# Patient Record
Sex: Male | Born: 1960 | Race: White | Hispanic: No | Marital: Married | Smoking: Never smoker
Health system: Southern US, Community
[De-identification: ages and names within clinical notes are randomized; demographics above are authoritative.]

## PROBLEM LIST (undated history)

## (undated) DIAGNOSIS — K219 Gastro-esophageal reflux disease without esophagitis: Secondary | ICD-10-CM

## (undated) DIAGNOSIS — R519 Headache, unspecified: Secondary | ICD-10-CM

## (undated) DIAGNOSIS — Z8489 Family history of other specified conditions: Secondary | ICD-10-CM

## (undated) DIAGNOSIS — Z8719 Personal history of other diseases of the digestive system: Secondary | ICD-10-CM

## (undated) DIAGNOSIS — M1990 Unspecified osteoarthritis, unspecified site: Secondary | ICD-10-CM

## (undated) DIAGNOSIS — R51 Headache: Secondary | ICD-10-CM

## (undated) DIAGNOSIS — J189 Pneumonia, unspecified organism: Secondary | ICD-10-CM

## (undated) HISTORY — PX: KNEE ARTHROSCOPY W/ MENISCAL REPAIR: SHX1877

## (undated) HISTORY — PX: HERNIA REPAIR: SHX51

## (undated) HISTORY — PX: COLONOSCOPY: SHX174

## (undated) HISTORY — PX: ESOPHAGOGASTRODUODENOSCOPY: SHX1529

## (undated) HISTORY — DX: Gastro-esophageal reflux disease without esophagitis: K21.9

## (undated) HISTORY — PX: TONSILLECTOMY AND ADENOIDECTOMY: SHX28

## (undated) HISTORY — PX: TRIGGER FINGER RELEASE: SHX641

---

## 2004-05-28 ENCOUNTER — Ambulatory Visit: Payer: Self-pay | Admitting: Internal Medicine

## 2004-09-20 ENCOUNTER — Ambulatory Visit: Payer: Self-pay | Admitting: Specialist

## 2004-10-21 ENCOUNTER — Ambulatory Visit: Payer: Self-pay | Admitting: Specialist

## 2011-10-08 ENCOUNTER — Emergency Department: Payer: Self-pay | Admitting: Emergency Medicine

## 2011-10-08 ENCOUNTER — Ambulatory Visit: Payer: Self-pay | Admitting: Family Medicine

## 2012-01-16 ENCOUNTER — Ambulatory Visit: Payer: Self-pay | Admitting: Gastroenterology

## 2012-01-16 LAB — HM COLONOSCOPY

## 2013-03-24 HISTORY — PX: COLONOSCOPY: SHX174

## 2013-11-11 ENCOUNTER — Ambulatory Visit: Payer: Self-pay | Admitting: Podiatry

## 2013-11-18 ENCOUNTER — Ambulatory Visit: Payer: 59 | Admitting: Podiatry

## 2013-11-18 ENCOUNTER — Encounter: Payer: Self-pay | Admitting: Podiatry

## 2013-11-18 VITALS — BP 127/83 | HR 72 | Resp 16 | Ht 67.0 in | Wt 215.0 lb

## 2013-11-18 DIAGNOSIS — M79609 Pain in unspecified limb: Secondary | ICD-10-CM

## 2013-11-18 DIAGNOSIS — B351 Tinea unguium: Secondary | ICD-10-CM

## 2013-11-18 NOTE — Progress Notes (Signed)
   Subjective:    Patient ID: Jonathan Mayer, male    DOB: 1960-11-28, 53 y.o.   MRN: [id number]  HPI Comments: Its the big toenail on my rt foot. Its been like this for 2 - 3 years. Its gotten worse. Its sensitive feeling. It snags my socks real bad. i havent done anything for it.      Review of Systems  All other systems reviewed and are negative.      Objective:   Physical Exam        Assessment & Plan:

## 2013-11-18 NOTE — Progress Notes (Signed)
Subjective:     Patient ID: Jonathan Mayer, male   DOB: 07/29/1960, 53 y.o.   MRN: [id number]  HPI patient presents stating I have a crack in my right big toenail for several years and some discoloration that I was concerned about   Review of Systems  All other systems reviewed and are negative.      Objective:   Physical Exam  Nursing note and vitals reviewed. Constitutional: He is oriented to person, place, and time.  Cardiovascular: Intact distal pulses.   Musculoskeletal: Normal range of motion.  Neurological: He is oriented to person, place, and time.  Skin: Skin is warm.   neurovascular status intact with muscle strength adequate and range of motion subtalar midtarsal joint within normal limits. 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 I've recommended topical antifungal formula 3 at this time and it the nailbed gets worse we will need to consider removal in the future

## 2017-03-13 ENCOUNTER — Ambulatory Visit (INDEPENDENT_AMBULATORY_CARE_PROVIDER_SITE_OTHER): Payer: 59 | Admitting: Family Medicine

## 2017-03-13 ENCOUNTER — Encounter: Payer: Self-pay | Admitting: Family Medicine

## 2017-03-13 VITALS — BP 136/81 | HR 67 | Temp 97.8°F | Ht 67.0 in | Wt 215.2 lb

## 2017-03-13 DIAGNOSIS — L723 Sebaceous cyst: Secondary | ICD-10-CM | POA: Diagnosis not present

## 2017-03-13 DIAGNOSIS — Z6833 Body mass index (BMI) 33.0-33.9, adult: Secondary | ICD-10-CM | POA: Diagnosis not present

## 2017-03-13 DIAGNOSIS — Z114 Encounter for screening for human immunodeficiency virus [HIV]: Secondary | ICD-10-CM | POA: Diagnosis not present

## 2017-03-13 DIAGNOSIS — Z13220 Encounter for screening for lipoid disorders: Secondary | ICD-10-CM | POA: Diagnosis not present

## 2017-03-13 DIAGNOSIS — E6609 Other obesity due to excess calories: Secondary | ICD-10-CM | POA: Insufficient documentation

## 2017-03-13 DIAGNOSIS — Z1159 Encounter for screening for other viral diseases: Secondary | ICD-10-CM | POA: Diagnosis not present

## 2017-03-13 DIAGNOSIS — K219 Gastro-esophageal reflux disease without esophagitis: Secondary | ICD-10-CM | POA: Diagnosis not present

## 2017-03-13 DIAGNOSIS — Z125 Encounter for screening for malignant neoplasm of prostate: Secondary | ICD-10-CM

## 2017-03-13 DIAGNOSIS — E669 Obesity, unspecified: Secondary | ICD-10-CM | POA: Insufficient documentation

## 2017-03-13 LAB — UA/M W/RFLX CULTURE, ROUTINE
Bilirubin, UA: NEGATIVE
GLUCOSE, UA: NEGATIVE
Ketones, UA: NEGATIVE
LEUKOCYTES UA: NEGATIVE
Nitrite, UA: NEGATIVE
PH UA: 7 (ref 5.0–7.5)
PROTEIN UA: NEGATIVE
RBC UA: NEGATIVE
SPEC GRAV UA: 1.02 (ref 1.005–1.030)
Urobilinogen, Ur: 0.2 mg/dL (ref 0.2–1.0)

## 2017-03-13 NOTE — Patient Instructions (Addendum)
Epidermal Cyst Removal, Care After Refer to this sheet in the next few weeks. These instructions provide you with information about caring for yourself after your procedure. Your health care provider may also give you more specific instructions. Your treatment has been planned according to current medical practices, but problems sometimes occur. Call your health care provider if you have any problems or questions after your procedure. What can I expect after the procedure? After the procedure, it is common to have:  Soreness in the area where your cyst was removed.  Tightness or itching from your skin sutures.  Follow these instructions at home:  Take medicines only as directed by your health care provider.  If you were prescribed an antibiotic medicine, finish all of it even if you start to feel better.  Use antibiotic ointment as directed by your health care provider. Follow the instructions carefully.  There are many different ways to close and cover an incision, including stitches (sutures), skin glue, and adhesive strips. Follow your health care provider's instructions about: ? Incision care. ? Bandage (dressing) changes and removal. ? Incision closure removal.  Keep the bandage (dressing) dry until your health care provider says that it can be removed. Take sponge baths only. Ask your health care provider when you can start showering or taking a bath.  After your dressing is off, check your incision every day for signs of infection. Watch for: ? Redness, swelling, or pain. ? Fluid, blood, or pus.  You can return to your normal activities. Do not do anything that stretches or puts pressure on your incision.  You can return to your normal diet.  Keep all follow-up visits as directed by your health care provider. This is important. Contact a health care provider if:  You have a fever.  Your incision bleeds.  You have redness, swelling, or pain in the incision area.  You  have fluid, blood, or pus coming from your incision.  Your cyst comes back after surgery. This information is not intended to replace advice given to you by your health care provider. Make sure you discuss any questions you have with your health care provider. Document Released: 03/31/2014 Document Revised: 08/16/2015 Document Reviewed: 11/23/2013 Elsevier Interactive Patient Education  2018 [employer]  

## 2017-03-13 NOTE — Assessment & Plan Note (Signed)
Will work on diet and exercise. Call with any concerns.

## 2017-03-13 NOTE — Progress Notes (Signed)
BP 136/81 (BP Location: Left Arm, Patient Position: Sitting, Cuff Size: Normal)   Pulse 67   Temp 97.8 F (36.6 C)   Ht 5\' 7"  (1.702 m)   Wt 215 lb 3 oz (97.6 kg)   SpO2 94%   BMI 33.70 kg/m    Subjective:    Patient ID: Jonathan Mayer, male    DOB: Aug 13, 1960, 56 y.o.   MRN: [id number]  HPI: Jonathan Mayer is a 56 y.o. male who presents today to reestablish care after not being seen since 2015.   Chief Complaint  Patient presents with  . Cyst   LUMP Duration: 8-10 years Location: back Onset: gradual Painful: no Discomfort: yes- itches Status:  not changing Trauma: no Redness: no Bruising: no Recent infection: no Swollen lymph nodes: no Requesting removal: yes History of cancer: no Family history of cancer: no History of the same: no Associated signs and symptoms: none  GERD- under good control with OTC medication. No other concerns.   Active Ambulatory Problems    Diagnosis Date Noted  . Gastroesophageal reflux disease 03/13/2017  . Class 1 obesity due to excess calories without serious comorbidity with body mass index (BMI) of 33.0 to 33.9 in adult 03/13/2017   Resolved Ambulatory Problems    Diagnosis Date Noted  . No Resolved Ambulatory Problems   Past Medical History:  Diagnosis Date  . GERD (gastroesophageal reflux disease)    Past Surgical History:  Procedure Laterality Date  . HERNIA REPAIR    . KNEE ARTHROSCOPY W/ MENISCAL REPAIR Right   . TONSILLECTOMY AND ADENOIDECTOMY      Outpatient Encounter Medications as of 03/13/2017  Medication Sig  . esomeprazole (NEXIUM) 40 MG capsule Take 40 mg by mouth daily at 12 noon.  . fluticasone (FLONASE) 50 MCG/ACT nasal spray Place into both nostrils daily.   No facility-administered encounter medications on file as of 03/13/2017.    Allergies  Allergen Reactions  . Aspirin Itching and Other (See Comments)    Eyes swell  . Ibuprofen Itching and Other (See Comments)    Eyes swell   Social  History   Socioeconomic History  . Marital status: Married    Spouse name: Not on file  . Number of children: Not on file  . Years of education: Not on file  . Highest education level: Not on file  Social Needs  . Financial resource strain: Not on file  . Food insecurity - worry: Not on file  . Food insecurity - inability: Not on file  . Transportation needs - medical: Not on file  . Transportation needs - non-medical: Not on file  Occupational History  . Not on file  Tobacco Use  . Smoking status: Never Smoker  . Smokeless tobacco: Never Used  Substance and Sexual Activity  . Alcohol use: No  . Drug use: No  . Sexual activity: Yes  Other Topics Concern  . Not on file  Social History Narrative  . Not on file   Family History  Problem Relation Age of Onset  . Osteoporosis Mother   . GER disease Father   . Cancer Paternal Uncle   . Hypertension Maternal Grandfather     Review of Systems  Constitutional: Negative.   Respiratory: Negative.   Cardiovascular: Negative.   Skin: Positive for color change. Negative for pallor, rash and wound.       Cyst on low back  Psychiatric/Behavioral: Negative.     Per HPI unless specifically indicated above  Objective:    BP 136/81 (BP Location: Left Arm, Patient Position: Sitting, Cuff Size: Normal)   Pulse 67   Temp 97.8 F (36.6 C)   Ht 5\' 7"  (1.702 m)   Wt 215 lb 3 oz (97.6 kg)   SpO2 94%   BMI 33.70 kg/m   Wt Readings from Last 3 Encounters:  03/13/17 215 lb 3 oz (97.6 kg)  11/18/13 215 lb (97.5 kg)    Physical Exam  Constitutional: He is oriented to person, place, and time. He appears well-developed and well-nourished. No distress.  HENT:  Head: Normocephalic and atraumatic.  Right Ear: Hearing normal.  Left Ear: Hearing normal.  Nose: Nose normal.  Eyes: Conjunctivae and lids are normal. Right eye exhibits no discharge. Left eye exhibits no discharge. No scleral icterus.  Cardiovascular: Normal rate,  regular rhythm, normal heart sounds and intact distal pulses. Exam reveals no gallop and no friction rub.  No murmur heard. Pulmonary/Chest: Effort normal and breath sounds normal. No respiratory distress. He has no wheezes. He has no rales. He exhibits no tenderness.  Musculoskeletal: Normal range of motion.  Neurological: He is alert and oriented to person, place, and time.  Skin: Skin is warm, dry and intact. No rash noted. He is not diaphoretic. No erythema. No pallor.  3.5 cm sebaceous cyst on R lower back  Psychiatric: He has a normal mood and affect. His speech is normal and behavior is normal. Judgment and thought content normal. Cognition and memory are normal.    Results for orders placed or performed in visit on 03/13/17  [facility] COLONOSCOPY  Result Value Ref Range   [facility] Colonoscopy Patient Reported See Report (in chart), Patient Reported      Assessment & Plan:   Problem List Items Addressed This Visit      Digestive   Gastroesophageal reflux disease    Under good control on OTC nexium. Continue to monitor. Call with any concerns.       Relevant Orders   CBC with Differential/Platelet   Comprehensive metabolic panel   TSH   UA/M w/rflx Culture, Routine     Other   Class 1 obesity due to excess calories without serious comorbidity with body mass index (BMI) of 33.0 to 33.9 in adult    Will work on diet and exercise. Call with any concerns.       Relevant Orders   Comprehensive metabolic panel   TSH   UA/M w/rflx Culture, Routine    Other Visit Diagnoses    Sebaceous cyst    -  Primary   Irriated when he sits on a hard backed chair. Would like it removed. Removed today as below. Return for stitches removal in 7-10 days   Relevant Orders   Comprehensive metabolic panel   UA/M w/rflx Culture, Routine   Screening for cholesterol level       Labs drawn today, await results.    Relevant Orders   Lipid Panel w/o Chol/HDL Ratio   Screening for prostate cancer        Labs drawn today, await results.    Relevant Orders   PSA   Need for hepatitis C screening test       Labs drawn today, await results.    Relevant Orders   Hepatitis C Antibody   Encounter for screening for human immunodeficiency virus (HIV)       Labs drawn today, await results.    Relevant Orders   HIV antibody  Skin Procedure  Procedure: Informed consent given.  Area infiltrated with lidocaine with epinephrine.  Lesioncompletely excised and wound closed and dressed    Diagnosis:   ICD-10-CM   1. Sebaceous cyst L72.3 Comprehensive metabolic panel    UA/M w/rflx Culture, Routine   Irriated when he sits on a hard backed chair. Would like it removed. Removed today as below. Return for stitches removal in 7-10 days  2. Screening for cholesterol level Z13.220 Lipid Panel w/o Chol/HDL Ratio   Labs drawn today, await results.   3. Gastroesophageal reflux disease, esophagitis presence not specified K21.9 CBC with Differential/Platelet    Comprehensive metabolic panel    TSH    UA/M w/rflx Culture, Routine  4. Screening for prostate cancer Z12.5 PSA   Labs drawn today, await results.   5. Class 1 obesity due to excess calories without serious comorbidity with body mass index (BMI) of 33.0 to 33.9 in adult E66.09 Comprehensive metabolic panel   H88.50 TSH    UA/M w/rflx Culture, Routine  6. Need for hepatitis C screening test Z11.59 Hepatitis C Antibody   Labs drawn today, await results.   7. Encounter for screening for human immunodeficiency virus (HIV) Z11.4 HIV antibody   Labs drawn today, await results.     Lesion Location/Size: 3.5cm lesion R lower back Physician: MJ Consent:  Risks, benefits, and alternative treatments discussed and all questions were answered.  Patient elected to proceed and verbal consent obtained.  Description: Area prepped and draped using semi-sterile technique. Area locally anesthetized using 3 cc's of lidocaine 2% with epi.  Using a 15 blade  scalpel, a elliptical incision was made encompassing the lesion. Cyst cavity entered and mild amount of cheesy material expressed.  Cyst wall was removed in tact using mosquito hemostat. Adequate hemostastis achieved using wound closure. Repair done using 6 4.0 vicryl simple interrupted stitches.  Post Procedure Instructions: Wound care instructions discussed and patient was instructed to keep area clean and dry.  Signs and symptoms of infection discussed, patient agrees to contact the office ASAP should they occur.  Dressing change recommended every other day. Suture removal in 7-10 days  Follow up plan: Return 7-10 days suture removal, for 3-6 months Physical.

## 2017-03-13 NOTE — Assessment & Plan Note (Signed)
Under good control on OTC nexium. Continue to monitor. Call with any concerns.

## 2017-03-14 LAB — CBC WITH DIFFERENTIAL/PLATELET
BASOS ABS: 0 10*3/uL (ref 0.0–0.2)
BASOS: 0 %
EOS (ABSOLUTE): 0.1 10*3/uL (ref 0.0–0.4)
Eos: 2 %
Hematocrit: 41.3 % (ref 37.5–51.0)
Hemoglobin: 12.9 g/dL — ABNORMAL LOW (ref 13.0–17.7)
IMMATURE GRANS (ABS): 0 10*3/uL (ref 0.0–0.1)
IMMATURE GRANULOCYTES: 0 %
LYMPHS: 20 %
Lymphocytes Absolute: 1.3 10*3/uL (ref 0.7–3.1)
MCH: 23.8 pg — ABNORMAL LOW (ref 26.6–33.0)
MCHC: 31.2 g/dL — ABNORMAL LOW (ref 31.5–35.7)
MCV: 76 fL — AB (ref 79–97)
Monocytes Absolute: 0.4 10*3/uL (ref 0.1–0.9)
Monocytes: 6 %
Neutrophils Absolute: 4.8 10*3/uL (ref 1.4–7.0)
Neutrophils: 72 %
PLATELETS: 325 10*3/uL (ref 150–379)
RBC: 5.42 x10E6/uL (ref 4.14–5.80)
RDW: 17.4 % — ABNORMAL HIGH (ref 12.3–15.4)
WBC: 6.7 10*3/uL (ref 3.4–10.8)

## 2017-03-14 LAB — COMPREHENSIVE METABOLIC PANEL
ALT: 26 IU/L (ref 0–44)
AST: 27 IU/L (ref 0–40)
Albumin/Globulin Ratio: 1.6 (ref 1.2–2.2)
Albumin: 4.4 g/dL (ref 3.5–5.5)
Alkaline Phosphatase: 100 IU/L (ref 39–117)
BUN/Creatinine Ratio: 15 (ref 9–20)
BUN: 20 mg/dL (ref 6–24)
Bilirubin Total: 0.2 mg/dL (ref 0.0–1.2)
CO2: 23 mmol/L (ref 20–29)
Calcium: 9.3 mg/dL (ref 8.7–10.2)
Chloride: 102 mmol/L (ref 96–106)
Creatinine, Ser: 1.35 mg/dL — ABNORMAL HIGH (ref 0.76–1.27)
GFR calc non Af Amer: 58 mL/min/{1.73_m2} — ABNORMAL LOW (ref >59)
GFR, EST AFRICAN AMERICAN: 67 mL/min/{1.73_m2} (ref >59)
Globulin, Total: 2.7 g/dL (ref 1.5–4.5)
Glucose: 81 mg/dL (ref 65–99)
Potassium: 5 mmol/L (ref 3.5–5.2)
Sodium: 139 mmol/L (ref 134–144)
TOTAL PROTEIN: 7.1 g/dL (ref 6.0–8.5)

## 2017-03-14 LAB — LIPID PANEL W/O CHOL/HDL RATIO
Cholesterol, Total: 150 mg/dL (ref 100–199)
HDL: 49 mg/dL (ref >39)
LDL Calculated: 74 mg/dL (ref 0–99)
Triglycerides: 133 mg/dL (ref 0–149)
VLDL Cholesterol Cal: 27 mg/dL (ref 5–40)

## 2017-03-14 LAB — TSH: TSH: 1.12 u[IU]/mL (ref 0.450–4.500)

## 2017-03-14 LAB — HIV ANTIBODY (ROUTINE TESTING W REFLEX): HIV Screen 4th Generation wRfx: NONREACTIVE

## 2017-03-14 LAB — HEPATITIS C ANTIBODY: Hep C Virus Ab: <0.1 s/co ratio (ref 0.0–0.9)

## 2017-03-14 LAB — PSA: Prostate Specific Ag, Serum: 0.9 ng/mL (ref 0.0–4.0)

## 2017-03-16 ENCOUNTER — Telehealth: Payer: Self-pay | Admitting: Family Medicine

## 2017-03-16 ENCOUNTER — Encounter: Payer: Self-pay | Admitting: Family Medicine

## 2017-03-16 DIAGNOSIS — D649 Anemia, unspecified: Secondary | ICD-10-CM | POA: Insufficient documentation

## 2017-03-16 DIAGNOSIS — R7989 Other specified abnormal findings of blood chemistry: Secondary | ICD-10-CM | POA: Insufficient documentation

## 2017-03-16 NOTE — Telephone Encounter (Signed)
Patient notified

## 2017-03-16 NOTE — Telephone Encounter (Signed)
Please let him know that he's a little anemic and his kidney function is up a bit. Everything else looks great. I'd like him to make sure he's drinking plenty of water and we'll check some more blood to find out why he's anemic when he comes back in for his physical. (If that's not booked- let's get him in early in the new year)

## 2017-03-20 ENCOUNTER — Encounter: Payer: Self-pay | Admitting: Family Medicine

## 2017-03-20 ENCOUNTER — Ambulatory Visit (INDEPENDENT_AMBULATORY_CARE_PROVIDER_SITE_OTHER): Payer: 59 | Admitting: Family Medicine

## 2017-03-20 VITALS — BP 148/91 | HR 72 | Wt 225.0 lb

## 2017-03-20 DIAGNOSIS — Z4802 Encounter for removal of sutures: Secondary | ICD-10-CM

## 2017-03-20 NOTE — Progress Notes (Signed)
Pt seen today for suture removal of 6 simple interrupted sutures from a cyst removal 12/21. Doing very well, no fevers, chills, sweats, redness, thick drainage. Pain has been minimal to none without any pain control.   VSS and WNL Steri strips remain intact, no surrounding redness or drainage at incision site. Healing very well. Steri strips removed, wound cleaned with alcohol and all 6 sutures were removed without complication. Wound was then cleaned with betadine wipe, dermabond and steri strips applied and tegaderm bandage placed.   Pt to come back in 5 days for wound check to make sure area is healing well. Return precautions given for fevers, sweats, redness, increasing pain to area, etc.

## 2017-03-23 ENCOUNTER — Ambulatory Visit (INDEPENDENT_AMBULATORY_CARE_PROVIDER_SITE_OTHER): Payer: 59 | Admitting: Unknown Physician Specialty

## 2017-03-23 ENCOUNTER — Encounter: Payer: Self-pay | Admitting: Unknown Physician Specialty

## 2017-03-23 VITALS — BP 135/84 | HR 70 | Temp 98.6°F | Wt 225.0 lb

## 2017-03-23 DIAGNOSIS — T8131XA Disruption of external operation (surgical) wound, not elsewhere classified, initial encounter: Secondary | ICD-10-CM | POA: Diagnosis not present

## 2017-03-23 NOTE — Patient Instructions (Signed)
OK to clean area with soap and water  Change bandage daily and as needed due to drainage  Place antibiotic ointment and gauze over area

## 2017-03-23 NOTE — Progress Notes (Signed)
   BP 135/84   Pulse 70   Temp 98.6 F (37 C) (Oral)   Wt 225 lb (102.1 kg)   SpO2 98%   BMI 35.24 kg/m    Subjective:    Patient ID: Jonathan Mayer, male    DOB: [birth year], 56 y.o.   MRN: [id number]  HPI: Jonathan Mayer is a 56 y.o. male  Chief Complaint  Patient presents with  . Wound Check    pt had sutures removed Friday, states the wound has been oozing a little over the weekend and would just like it checked out    Pt had a sebaceous cyst removed on back.  Sutures removed 3 days ago.  Area was covered with gauze and op-site.  Noted   Relevant past medical, surgical, family and social history reviewed and updated as indicated. Interim medical history since our last visit reviewed. Allergies and medications reviewed and updated.  Review of Systems  Per HPI unless specifically indicated above     Objective:    BP 135/84   Pulse 70   Temp 98.6 F (37 C) (Oral)   Wt 225 lb (102.1 kg)   SpO2 98%   BMI 35.24 kg/m   Wt Readings from Last 3 Encounters:  03/23/17 225 lb (102.1 kg)  03/20/17 225 lb (102.1 kg)  03/13/17 215 lb 3 oz (97.6 kg)    Physical Exam  Constitutional: He is oriented to person, place, and time. He appears well-developed and well-nourished. No distress.  HENT:  Head: Normocephalic and atraumatic.  Eyes: Conjunctivae and lids are normal. Right eye exhibits no discharge. Left eye exhibits no discharge. No scleral icterus.  Cardiovascular: Normal rate.  Pulmonary/Chest: Effort normal.  Abdominal: Normal appearance. There is no splenomegaly or hepatomegaly.  Musculoskeletal: Normal range of motion.  Neurological: He is alert and oriented to person, place, and time.  Skin: Skin is intact.  Wound on back has completely come apart.  Area cleaned, antibiotic ointment placed over area and covered with gauze  Psychiatric: He has a normal mood and affect. His behavior is normal. Judgment and thought content normal.    Results for orders placed or  performed in visit on 03/20/17  [facility] COLONOSCOPY  Result Value Ref Range   [facility] Colonoscopy See Report (in chart) See Report (in chart), Patient Reported      Assessment & Plan:   Problem List Items Addressed This Visit    None    Visit Diagnoses    Dehiscence of operative wound, initial encounter    -  Primary   Pt ed on care.  place antibiotic ointment over area and keep covered with gauze.  Change bandage as often as needed.  Recheck in 4 days.  Written instr given       Follow up plan: Return for appt 3p Fri.

## 2017-03-25 ENCOUNTER — Ambulatory Visit: Payer: 59 | Admitting: Family Medicine

## 2017-03-27 ENCOUNTER — Encounter: Payer: Self-pay | Admitting: Unknown Physician Specialty

## 2017-03-27 ENCOUNTER — Ambulatory Visit (INDEPENDENT_AMBULATORY_CARE_PROVIDER_SITE_OTHER): Payer: 59 | Admitting: Unknown Physician Specialty

## 2017-03-27 DIAGNOSIS — T8130XA Disruption of wound, unspecified, initial encounter: Secondary | ICD-10-CM | POA: Insufficient documentation

## 2017-03-27 NOTE — Assessment & Plan Note (Addendum)
Wound healing well.  Continue to cover with antibiotic ointment and bandage.  Pt ed on care

## 2017-03-27 NOTE — Progress Notes (Signed)
   BP 126/84   Pulse 74   Temp 99.4 F (37.4 C) (Oral)   Wt 220 lb 12.8 oz (100.2 kg)   SpO2 97%   BMI 34.58 kg/m    Subjective:    Patient ID: Jonathan Mayer, male    DOB: 06-Jan-1961, 57 y.o.   MRN: [id number]  HPI: Jonathan Mayer is a 57 y.o. male  Chief Complaint  Patient presents with  . Wound Check   Dehisced wound Pt here for wound recheck.  Changing dressing daily.  Some drainage on gauze  Relevant past medical, surgical, family and social history reviewed and updated as indicated. Interim medical history since our last visit reviewed. Allergies and medications reviewed and updated.  Review of Systems  Per HPI unless specifically indicated above     Objective:    BP 126/84   Pulse 74   Temp 99.4 F (37.4 C) (Oral)   Wt 220 lb 12.8 oz (100.2 kg)   SpO2 97%   BMI 34.58 kg/m   Wt Readings from Last 3 Encounters:  03/27/17 220 lb 12.8 oz (100.2 kg)  03/23/17 225 lb (102.1 kg)  03/20/17 225 lb (102.1 kg)    Physical Exam  Constitutional: He is oriented to person, place, and time. He appears well-developed and well-nourished. No distress.  HENT:  Head: Normocephalic and atraumatic.  Eyes: Conjunctivae and lids are normal. Right eye exhibits no discharge. Left eye exhibits no discharge. No scleral icterus.  Cardiovascular: Normal rate.  Pulmonary/Chest: Effort normal.  Abdominal: Normal appearance. There is no splenomegaly or hepatomegaly.  Musculoskeletal: Normal range of motion.  Neurological: He is alert and oriented to person, place, and time.  Skin: Skin is intact. No rash noted. No pallor.  Wound on back without erythema.  Healthy tissue  Psychiatric: He has a normal mood and affect. His behavior is normal. Judgment and thought content normal.    Results for orders placed or performed in visit on 03/20/17  [facility] COLONOSCOPY  Result Value Ref Range   [facility] Colonoscopy See Report (in chart) See Report (in chart), Patient Reported      Assessment & Plan:    Problem List Items Addressed This Visit      Unprioritized   Wound dehiscence    Wound healing well.  Continue to cover with antibiotic ointment and bandage.  Pt ed on care          Follow up plan: No Follow-up on file.

## 2018-05-18 ENCOUNTER — Emergency Department
Admission: EM | Admit: 2018-05-18 | Discharge: 2018-05-19 | Disposition: A | Discharge status: Home / Self Care | Payer: 59 | Attending: Emergency Medicine | Admitting: Emergency Medicine

## 2018-05-18 ENCOUNTER — Other Ambulatory Visit: Payer: Self-pay

## 2018-05-18 ENCOUNTER — Encounter: Payer: Self-pay | Admitting: Emergency Medicine

## 2018-05-18 DIAGNOSIS — Z79899 Other long term (current) drug therapy: Secondary | ICD-10-CM | POA: Diagnosis not present

## 2018-05-18 DIAGNOSIS — K649 Unspecified hemorrhoids: Secondary | ICD-10-CM | POA: Insufficient documentation

## 2018-05-18 MED ORDER — HYDROCORTISONE 2.5 % RE CREA: TOPICAL_CREAM | RECTAL | 1 refills | Status: DC

## 2018-05-18 MED ORDER — LIDOCAINE 2 % EX GEL: 1.0000 "application " | Freq: Three times a day (TID) | CUTANEOUS | 1 refills | Status: DC | PRN

## 2018-05-18 NOTE — ED Provider Notes (Signed)
[facility] Emergency Department Provider Note  ____________________________________________  Time seen: Approximately 11:47 PM  I have reviewed the triage vital signs and the nursing notes.   HISTORY  Chief Complaint Hemorrhoids    HPI Jonathan Mayer is a 58 y.o. male who presents the emergency department complaining of bleeding hemorrhoid.  Patient states that he has had issues with hemorrhoids since his 4s.  Patient has only had 1 episode of thrombosed hemorrhoid that was drained 7 years ago.  Patient states that he has had increased hemorrhoid symptoms over the past several days.  Tonight as he was wiping, he notes blood to the toilet paper.  Patient reports that this started to "drip" then passed a "possible clot" that had some more bleeding.  Patient was able to control the bleeding prior to arrival.  He is concerned that he may need a thrombosed hemorrhoid drained for further management.  Patient is using Preparation H.  No other medications for his complaint prior to arrival.  Patient does have some intermittent issues with constipation but states that he is using the bathroom normally at this time.  He denies any significant straining.  He denies any significant blood loss from hemorrhoid.    Past Medical History:  Diagnosis Date  . GERD (gastroesophageal reflux disease)     Patient Active Problem List   Diagnosis Date Noted  . Wound dehiscence 03/27/2017  . Anemia 03/16/2017  . Elevated serum creatinine 03/16/2017  . Gastroesophageal reflux disease 03/13/2017  . Class 1 obesity due to excess calories without serious comorbidity with body mass index (BMI) of 33.0 to 33.9 in adult 03/13/2017    Past Surgical History:  Procedure Laterality Date  . HERNIA REPAIR    . KNEE ARTHROSCOPY W/ MENISCAL REPAIR Right   . TONSILLECTOMY AND ADENOIDECTOMY      Prior to Admission medications   Medication Sig Start Date End Date Taking? Authorizing Provider   esomeprazole (NEXIUM) 40 MG capsule Take 40 mg by mouth daily at 12 noon.    [provider]  fluticasone (FLONASE) 50 MCG/ACT nasal spray Place into both nostrils daily.    [provider]  hydrocortisone (ANUSOL-HC) 2.5 % rectal cream Apply rectally 2 times daily 05/18/18 05/18/19  Cuthriell, Charline Bills, PA-C  Lidocaine 2 % GEL Apply 1 application topically 3 (three) times daily as needed. 05/18/18   Cuthriell, Charline Bills, PA-C    Allergies Aspirin and Ibuprofen  Family History  Problem Relation Age of Onset  . Osteoporosis Mother   . GER disease Father   . Cancer Paternal Uncle   . Hypertension Maternal Grandfather     Social History Social History   Tobacco Use  . Smoking status: Never Smoker  . Smokeless tobacco: Never Used  Substance Use Topics  . Alcohol use: No  . Drug use: No     Review of Systems  Constitutional: No fever/chills Eyes: No visual changes. No discharge ENT: No upper respiratory complaints. Cardiovascular: no chest pain. Respiratory: no cough. No SOB. Gastrointestinal: No abdominal pain.  No nausea, no vomiting.  No diarrhea.  No constipation.  Positive for bleeding hemorrhoid Musculoskeletal: Negative for musculoskeletal pain. Skin: Negative for rash, abrasions, lacerations, ecchymosis. Neurological: Negative for headaches, focal weakness or numbness. 10-point ROS otherwise negative.  ____________________________________________   PHYSICAL EXAM:  VITAL SIGNS: ED Triage Vitals [05/18/18 2129]  Enc Vitals Group     BP (!) 152/86     Pulse Rate 66  Resp 18     Temp 97.9 F (36.6 C)     Temp Source Oral     SpO2 99 %     Weight 220 lb (99.8 kg)     Height 5\' 7"  (1.702 m)     Head Circumference      Peak Flow      Pain Score      Pain Loc      Pain Edu?      Excl. in [place]?      Constitutional: Alert and oriented. Well appearing and in no acute distress. Eyes: Conjunctivae are normal. PERRL. EOMI. Head:  Atraumatic. Neck: No stridor.    Cardiovascular: Normal rate, regular rhythm. Normal S1 and S2.  Good peripheral circulation. Respiratory: Normal respiratory effort without tachypnea or retractions. Lungs CTAB. Good air entry to the bases with no decreased or absent breath sounds. Gastrointestinal: Bowel sounds 4 quadrants. Soft and nontender to palpation. No guarding or rigidity. No palpable masses. No distention. No CVA tenderness.  Visualization of the anus reveals external hemorrhoid.  This is not thrombosed at this time.  Patient does have indication of bleeding from hemorrhoid but no active bleeding.  No surrounding indication of infection. Musculoskeletal: Full range of motion to all extremities. No gross deformities appreciated. Neurologic:  Normal speech and language. No gross focal neurologic deficits are appreciated.  Skin:  Skin is warm, dry and intact. No rash noted. Psychiatric: Mood and affect are normal. Speech and behavior are normal. Patient exhibits appropriate insight and judgement.   ____________________________________________   LABS (all labs ordered are listed, but only abnormal results are displayed)  Labs Reviewed - No data to display ____________________________________________  EKG   ____________________________________________  RADIOLOGY   No results found.  ____________________________________________    PROCEDURES  Procedure(s) performed:    Procedures    Medications - No data to display   ____________________________________________   INITIAL IMPRESSION / ASSESSMENT AND PLAN / ED COURSE  Pertinent labs & imaging results that were available during my care of the patient were reviewed by me and considered in my medical decision making (see chart for details).  Review of the  [place] was performed in accordance of the [place] prior to dispensing any controlled drugs.      Patient's diagnosis is consistent with bleeding hemorrhoid.   Patient presents the emergency department for complaint of bleeding hemorrhoid.  Patient states that he has had issues with hemorrhoids for the past 30 years.  Patient had an exposed external hemorrhoid over the past several days.  Tonight it began to bleed.  Patient was able to control bleeding prior to arrival.  On exam, patient does have an external hemorrhoid but no indication of thrombosed hemorrhoid.  Patient likely had mild thrombosis but with rupture of hemorrhoid on its own this has resolved.  No active bleeding.  At this time, no indication for drainage of the hemorrhoid as it is completely flat.  Patient will be prescribed Anusol and topical lidocaine for symptom relief.  I have advised the patient to follow-up with primary care or general surgery as needed.  Return precautions are discussed with patient..  Patient is given ED precautions to return to the ED for any worsening or new symptoms.     ____________________________________________  FINAL CLINICAL IMPRESSION(S) / ED DIAGNOSES  Final diagnoses:  Bleeding hemorrhoid      NEW MEDICATIONS STARTED DURING THIS VISIT:  ED Discharge Orders         Ordered  hydrocortisone (ANUSOL-HC) 2.5 % rectal cream     05/18/18 2350    Lidocaine 2 % GEL  3 times daily PRN     05/18/18 2350              This chart was dictated using voice recognition software/Dragon. Despite best efforts to proofread, errors can occur which can change the meaning. Any change was purely unintentional.    Darletta Moll, PA-C 05/19/18 0043    Nance Pear, MD 05/19/18 [phone]

## 2018-05-18 NOTE — ED Triage Notes (Addendum)
Patient ambulatory to triage with steady gait, without difficulty or distress noted; pt reports hemorrhoidal pain since Saturday, noted blood from hemorroid when wiping tonight; st hx of same since young age; denies any accomp symptoms

## 2018-05-19 ENCOUNTER — Ambulatory Visit: Payer: Self-pay

## 2018-05-19 ENCOUNTER — Ambulatory Visit (INDEPENDENT_AMBULATORY_CARE_PROVIDER_SITE_OTHER): Payer: 59 | Admitting: General Surgery

## 2018-05-19 ENCOUNTER — Encounter: Payer: Self-pay | Admitting: General Surgery

## 2018-05-19 ENCOUNTER — Other Ambulatory Visit: Payer: Self-pay

## 2018-05-19 VITALS — BP 135/91 | HR 69 | Temp 97.7°F | Ht 67.0 in | Wt 212.8 lb

## 2018-05-19 DIAGNOSIS — K645 Perianal venous thrombosis: Secondary | ICD-10-CM | POA: Diagnosis not present

## 2018-05-19 NOTE — Telephone Encounter (Signed)
Pt. Reports he went to ED for external hemorrhoid bleeding. Stopped bleeding and he was sent home. Was told to follow up with Dr. [name], General surgeon. States he has had more bleeding this morning. Discussed with Christan. Pt. Instructed to call surgeon for an appointment. If they did not receive the ED referral, pt. Is to call us back. Pt. Verbalizes understanding. ED follow appointment made for pt. With Dr. Wynetta Emery.  Answer Assessment - Initial Assessment Questions 1. APPEARANCE of BLOOD: "What color is it?" "Is it passed separately, on the surface of the stool, or mixed in with the stool?"      Bright red 2. AMOUNT: "How much blood was passed?"      Unsure, but bled 20 minutes 3. FREQUENCY: "How many times has blood been passed with the stools?"      Not with stool - bleeding hemorrhoid 4. ONSET: "When was the blood first seen in the stools?" (Days or weeks)      Started Saturday. 5. DIARRHEA: "Is there also some diarrhea?" If so, ask: "How many diarrhea stools were passed in past 24 hours?"      No 6. CONSTIPATION: "Do you have constipation?" If so, "How bad is it?"     Yes 7. RECURRENT SYMPTOMS: "Have you had blood in your stools before?" If so, ask: "When was the last time?" and "What happened that time?"      Has this in the past 8. BLOOD THINNERS: "Do you take any blood thinners?" (e.g., Coumadin/warfarin, Pradaxa/dabigatran, aspirin)     No 9. OTHER SYMPTOMS: "Do you have any other symptoms?"  (e.g., abdominal pain, vomiting, dizziness, fever)     No 10. PREGNANCY: "Is there any chance you are pregnant?" "When was your last menstrual period?"       n/a  Protocols used: RECTAL BLEEDING-A-AH

## 2018-05-19 NOTE — Patient Instructions (Addendum)
You will need to pick up 2 Fleets enema. Please use one the night prior to your surgery and the other the day of your surgery.    You have requested to have Hemorrhoid surgery today.   You will be required to do 2 enemas prior to your surgery. The first will be the night prior and the second will be done the morning of surgery.  Constipation is going to be your biggest obstacle following surgery. Use all stool softeners and laxatives as prescribed after your surgery and be sure to drink 72 ounces of water or more every day. This will help to avoid constipation. If you do all of this and you are still having difficulty, please call our office for further instructions as soon as you begin to have difficulty with bowel movements.  You may want to buy a disposable Sitz bath prior to surgery to aid in pain and cleanliness after surgery. The information on how to do use a disposable sitz bath or using a bath tub are below.   Hemorrhoid Surgery After Care Refer to this sheet in the next few weeks. These instructions provide you with information about caring for yourself after your procedure. Your health care provider may also give you more specific instructions. Your treatment has been planned according to current medical practices, but problems sometimes occur. Call your health care provider if you have any problems or questions after your procedure. What can I expect after the procedure? After the procedure, it is common to have:  Rectal pain.  Pain when you are having a bowel movement.  Slight rectal bleeding.  Follow these instructions at home: Medicines  Take over-the-counter and prescription medicines only as told by your health care provider.  Do not drive or operate heavy machinery while taking prescription pain medicine.  Use a stool softener or a bulk laxative as told by your health care provider. Activity  Rest at home. Return to your normal activities as told by your health care  provider.  Do not lift anything that is heavier than 10 lb (4.5 kg).  Do not sit for long periods of time. Take a walk every day or as told by your health care provider.  Do not strain to have a bowel movement. Do not spend a long time sitting on the toilet. Eating and drinking  Eat foods that contain fiber, such as whole grains, beans, nuts, fruits, and vegetables.  Drink enough fluid to keep your urine clear or pale yellow. General instructions  Sit in a warm bath 2-3 times per day to relieve soreness or itching.  Keep all follow-up visits as told by your health care provider. This is important. Contact a health care provider if:  Your pain medicine is not helping.  You have a fever or chills.  You become constipated.  You have trouble passing urine. Get help right away if:  You have very bad rectal pain.  You have heavy bleeding from your rectum. This information is not intended to replace advice given to you by your health care provider. Make sure you discuss any questions you have with your health care provider. Document Released: 05/31/2003 Document Revised: 08/16/2015 Document Reviewed: 06/05/2014 Elsevier Interactive Patient Education  2018 [place] A disposable sitz bath is a plastic basin that fits over the toilet. A bag is hung above the toilet, and the bag is connected to a tube that opens into the basin. The bag is filled with  warm water that flows into the basin through the tube. A sitz bath can be used to help relieve symptoms, clean, and promote healing in the genital and anal areas, as well as in the lower abdomen and buttocks. What are the risks? Sitz baths are generally very safe. It is possible for the skin between the genitals and the anus (perineum) to become infected, but this is rare. You can avoid this by cleaning your sitz bath supplies thoroughly. How to use a disposable sitz bath 1. Close the clamp on the tube. Make  sure the clamp is closed tightly to prevent leakage. 2. Fill the sitz bath basin and the plastic bag with warm water. The water should be warm enough to be comfortable, but not hot. 3. Raise the toilet seat and place the filled basin on the toilet. Make sure the overflow opening is facing toward the back of the toilet. ? If you prefer, you may place the empty basin on the toilet first, and then use the plastic bag to fill the basin with warm water. 4. Hang the filled plastic bag overhead on a hook or towel rack close to the toilet. The bag should be higher than the toilet so that the water will flow down through the tube. 5. Attach the tube to the opening on the basin. Make sure that the tube is attached to the basin tightly to prevent leakage. 6. Sit on the basin and release the clamp. This will allow warm water to flow into the basin and flush the area around your genitals and anus. 7. Remain sitting on the basin for about 15-20 minutes, or as long as told by your health care provider. 8. Stand up and gently pat your skin dry. If directed, apply clean bandages (dressings) to the affected area as told by your health care provider. 9. Carefully remove the basin from the toilet seat and tip the basin into the toilet to empty any remaining water. Empty any remaining water from the plastic bag into the toilet. Then, flush the toilet. 10. Wash the basin with warm water and soap. Let the basin air dry in the sink. You should also let the plastic bag and the tubing air dry. 11. Store the basin, tubing, and plastic bag in a clean, dry area. 12. Wash your hands with soap and water. If soap and water are not available, use hand sanitizer. Contact a health care provider if:  You have symptoms that get worse instead of better.  You develop new skin irritation, redness, or swelling around your genitals or anus. This information is not intended to replace advice given to you by your health care provider. Make  sure you discuss any questions you have with your health care provider. Document Released: 09/09/2011 Document Revised: 08/16/2015 Document Reviewed: 01/28/2015 Elsevier Interactive Patient Education  2018 [employer].   How to Take a [employer] A sitz bath is a warm water bath that is taken while you are sitting down. The water should only come up to your hips and should cover your buttocks. Your health care provider may recommend a sitz bath to help you:  Clean the lower part of your body, including your genital area.  With itching.  With pain.  With sore muscles or muscles that tighten or spasm.  How to take a sitz bath Take 3-4 sitz baths per day or as told by your health care provider. 1. Partially fill a bathtub with warm water. You will only need  the water to be deep enough to cover your hips and buttocks when you are sitting in it. 2. If your health care provider told you to put medicine in the water, follow the directions exactly. 3. Sit in the water and open the tub drain a little. 4. Turn on the warm water again to keep the tub at the correct level. Keep the water running constantly. 5. Soak in the water for 15-20 minutes or as told by your health care provider. 6. After the sitz bath, pat the affected area dry first. Do not rub it. 7. Be careful when you stand up after the sitz bath because you may feel dizzy.  Contact a health care provider if:  Your symptoms get worse. Do not continue with sitz baths if your symptoms get worse.  You have new symptoms. Do not continue with sitz baths until you talk with your health care provider. This information is not intended to replace advice given to you by your health care provider. Make sure you discuss any questions you have with your health care provider. Document Released: 12/01/2003 Document Revised: 08/08/2015 Document Reviewed: 03/08/2014 Elsevier Interactive Patient Education  2018 [employer].          How to  Take a [employer] A sitz bath is a warm water bath that may be used to care for your rectum, genital area, or the area between your rectum and genitals (perineum). For a sitz bath, the water only comes up to your hips and covers your buttocks. A sitz bath may done at home in a bathtub or with a portable sitz bath that fits over the toilet. Your health care provider may recommend a sitz bath to help:  Relieve pain and discomfort after delivering a baby.  Relieve pain and itching from hemorrhoids or anal fissures.  Relieve pain after certain surgeries.  Relax muscles that are sore or tight. How to take a sitz bath Take 3-4 sitz baths a day, or as many as told by your health care provider. Bathtub sitz bath To take a sitz bath in a bathtub: 1. Partially fill a bathtub with warm water. The water should be deep enough to cover your hips and buttocks when you are sitting in the tub. 2. If your health care provider told you to put medicine in the water, follow his or her instructions. 3. Sit in the water. 4. Open the tub drain a little, and leave it open during your bath. 5. Turn on the warm water again, enough to replace the water that is draining out. Keep the water running throughout your bath. This helps keep the water at the right level and the right temperature. 6. Soak in the water for 15-20 minutes, or as long as told by your health care provider. 7. When you are done, be careful when you stand up. You may feel dizzy. 8. After the sitz bath, pat yourself dry. Do not rub your skin to dry it.  Over-the-toilet sitz bath To take a sitz bath with an over-the-toilet basin: 1. Follow the manufacturer's instructions. 2. Fill the basin with warm water. 3. If your health care provider told you to put medicine in the water, follow his or her instructions. 4. Sit on the seat. Make sure the water covers your buttocks and perineum. 5. Soak in the water for 15-20 minutes, or as long as told by your  health care provider. 6. After the sitz bath, pat yourself dry. Do not rub your  skin to dry it. 7. Clean and dry the basin between uses. 8. Discard the basin if it cracks, or according to the manufacturer's instructions. Contact a health care provider if:  Your symptoms get worse. Do not continue with sitz baths if your symptoms get worse.  You have new symptoms. If this happens, do not continue with sitz baths until you talk with your health care provider. Summary  A sitz bath is a warm water bath in which the water only comes up to your hips and covers your buttocks.  A sitz bath may help relieve itching, relieve pain, and relax muscles that are sore or tight in the lower part of your body, including your genital area.  Take 3-4 sitz baths a day, or as many as told by your health care provider. Soak in the water for 15-20 minutes.  Do not continue with sitz baths if your symptoms get worse. This information is not intended to replace advice given to you by your health care provider. Make sure you discuss any questions you have with your health care provider. Document Released: 12/01/2003 Document Revised: 03/12/2017 Document Reviewed: 03/12/2017 Elsevier Interactive Patient Education  2019 [place] capsules What is this medicine? DOCUSATE (doc CUE sayt) is stool softener. It helps prevent constipation and straining or discomfort associated with hard or dry stools. This medicine may be used for other purposes; ask your health care provider or pharmacist if you have questions. COMMON BRAND NAME(S): Colace, Colace Clear, Correctol, D.O.S., DC, Doc-Q-Lace, DocuLace, Docusoft S, DOK, DOK Extra Strength, Dulcolax, Genasoft, Kao-Tin, Kaopectate Liqui-Gels, Phillips Stool Softener, Stool Softener, Stool Softner DC, Sulfolax, Sur-Q-Lax, Surfak, Uni-Ease What should I tell my health care provider before I take this medicine? They need to know if you have any of these  conditions: -nausea or vomiting -severe constipation -stomach pain -sudden change in bowel habit lasting more than 2 weeks -an unusual or allergic reaction to docusate, other medicines, foods, dyes, or preservatives -pregnant or trying to get pregnant -breast-feeding How should I use this medicine? Take this medicine by mouth with a glass of water. Follow the directions on the label. Take your doses at regular intervals. Do not take your medicine more often than directed. Talk to your pediatrician regarding the use of this medicine in children. While this medicine may be prescribed for children as young as 2 years for selected conditions, precautions do apply. Overdosage: If you think you have taken too much of this medicine contact a poison control center or emergency room at once. NOTE: This medicine is only for you. Do not share this medicine with others. What if I miss a dose? If you miss a dose, take it as soon as you can. If it is almost time for your next dose, take only that dose. Do not take double or extra doses. What may interact with this medicine? -mineral oil This list may not describe all possible interactions. Give your health care provider a list of all the medicines, herbs, non-prescription drugs, or dietary supplements you use. Also tell them if you smoke, drink alcohol, or use illegal drugs. Some items may interact with your medicine. What should I watch for while using this medicine? Do not use for more than one week without advice from your doctor or health care professional. If your constipation returns, check with your doctor or health care professional. Drink plenty of water while taking this medicine. Drinking water helps decrease constipation.  Stop using this medicine and contact your doctor or health care professional if you experience any rectal bleeding or do not have a bowel movement after use. These could be signs of a more serious condition. What side effects  may I notice from receiving this medicine? Side effects that you should report to your doctor or health care professional as soon as possible: -allergic reactions like skin rash, itching or hives, swelling of the face, lips, or tongue Side effects that usually do not require medical attention (report to your doctor or health care professional if they continue or are bothersome): -diarrhea -stomach cramps -throat irritation This list may not describe all possible side effects. Call your doctor for medical advice about side effects. You may report side effects to FDA at 1-800-FDA-1088. Where should I keep my medicine? Keep out of the reach of children. Store at room temperature between 15 and 30 degrees C (59 and 86 degrees F). Throw away any unused medicine after the expiration date. NOTE: This sheet is a summary. It may not cover all possible information. If you have questions about this medicine, talk to your doctor, pharmacist, or health care provider.  2019 Elsevier/Gold Standard (2007-07-01 15:56:49)    Fiber Content in Foods  See the following list for the dietary fiber content of some common foods. High-fiber foods High-fiber foods contain 4 grams or more (4g or more) of fiber per serving. They include:  Artichoke (fresh) - 1 medium has 10.3g of fiber.  Baked beans, plain or vegetarian (canned) -  cup has 5.2g of fiber.  Blackberries or raspberries (fresh) -  cup has 4g of fiber.  Bran cereal -  cup has 8.6g of fiber.  Bulgur (cooked) -  cup has 4g of fiber.  Kidney beans (canned) -  cup has 6.8g of fiber.  Lentils (cooked) -  cup has 7.8g of fiber.  Pear (fresh) - 1 medium has 5.1g of fiber.  Peas (frozen) -  cup has 4.4g of fiber.  Pinto beans (canned) -  cup has 5.5g of fiber.  Pinto beans (dried and cooked) -  cup has 7.7g of fiber.  Potato with skin (baked) - 1 medium has 4.4g of fiber.  Quinoa (cooked) -  cup has 5g of fiber.  Soybeans (canned,  frozen, or fresh) -  cup has 5.1g of fiber. Moderate-fiber foods Moderate-fiber foods contain 1-4 grams (1-4g) of fiber per serving. They include:  Almonds - 1 oz. has 3.5g of fiber.  Apple with skin - 1 medium has 3.3g of fiber.  Applesauce, sweetened -  cup has 1.5g of fiber.  Bagel, plain - one 4-inch (10-cm) bagel has 2g of fiber.  Banana - 1 medium has 3.1g of fiber.  Broccoli (cooked) -  cup has 2.5g of fiber.  Carrots (cooked) -  cup has 2.3g of fiber.  Corn (canned or frozen) -  cup has 2.1g of fiber.  Corn tortilla - one 6-inch (15-cm) tortilla has 1.5g of fiber.  Green beans (canned) -  cup has 2g of fiber.  Instant oatmeal -  cup has about 2g of fiber.  Long-grain [place] rice (cooked) - 1 cup has 3.5g of fiber.  Macaroni, enriched (cooked) - 1 cup has 2.5g of fiber.  Melon - 1 cup has 1.4g of fiber.  Multigrain cereal -  cup has about 2-4g of fiber.  Orange - 1 small has 3.1g of fiber.  Potatoes, mashed -  cup has 1.6g of fiber.  Raisins - 1/4 cup  has 1.6g of fiber.  Squash -  cup has 2.9g of fiber.  Sunflower seeds -  cup has 1.1g of fiber.  Tomato - 1 medium has 1.5g of fiber.  Vegetable or soy patty - 1 has 3.4g of fiber.  Whole-wheat bread - 1 slice has 2g of fiber.  Whole-wheat spaghetti -  cup has 3.2g of fiber. Low-fiber foods Low-fiber foods contain less than 1 gram (less than 1g) of fiber per serving. They include:  Egg - 1 large.  Flour tortilla - one 6-inch (15-cm) tortilla.  Fruit juice -  cup.  Lettuce - 1 cup.  Meat, poultry, or fish - 1 oz.  Milk - 1 cup.  Spinach (raw) - 1 cup.  White bread - 1 slice.  White rice -  cup.  Yogurt -  cup. Actual amounts of fiber in foods may be different depending on processing. Talk with your dietitian about how much fiber you need in your diet. This information is not intended to replace advice given to you by your health care provider. Make sure you discuss any  questions you have with your health care provider. Document Released: 07/27/2006 Document Revised: 08/16/2015 Document Reviewed: 05/03/2015 Elsevier Interactive Patient Education  2019 [employer]      Hemorrhoids Hemorrhoids are swollen veins that may develop:  In the butt (rectum). These are called internal hemorrhoids.  Around the opening of the butt (anus). These are called external hemorrhoids. Hemorrhoids can cause pain, itching, or bleeding. Most of the time, they do not cause serious problems. They usually get better with diet changes, lifestyle changes, and other home treatments. What are the causes? This condition may be caused by:  Having trouble pooping (constipation).  Pushing hard (straining) to poop.  Watery poop (diarrhea).  Pregnancy.  Being very overweight (obese).  Sitting for long periods of time.  Heavy lifting or other activity that causes you to strain.  Anal sex.  Riding a bike for a long period of time. What are the signs or symptoms? Symptoms of this condition include:  Pain.  Itching or soreness in the butt.  Bleeding from the butt.  Leaking poop.  Swelling in the area.  One or more lumps around the opening of your butt. How is this diagnosed? A doctor can often diagnose this condition by looking at the affected area. The doctor may also:  Do an exam that involves feeling the area with a gloved hand (digital rectal exam).  Examine the area inside your butt using a small tube (anoscope).  Order blood tests. This may be done if you have lost a lot of blood.  Have you get a test that involves looking inside the colon using a flexible tube with a camera on the end (sigmoidoscopy or colonoscopy). How is this treated? This condition can usually be treated at home. Your doctor may tell you to change what you eat, make lifestyle changes, or try home treatments. If these do not help, procedures can be done to remove the hemorrhoids or  make them smaller. These may involve:  Placing rubber bands at the base of the hemorrhoids to cut off their blood supply.  Injecting medicine into the hemorrhoids to shrink them.  Shining a type of light energy onto the hemorrhoids to cause them to fall off.  Doing surgery to remove the hemorrhoids or cut off their blood supply. Follow these instructions at home: Eating and drinking   Eat foods that have a lot of fiber in them.  These include whole grains, beans, nuts, fruits, and vegetables.  Ask your doctor about taking products that have added fiber (fibersupplements).  Reduce the amount of fat in your diet. You can do this by: ? Eating low-fat dairy products. ? Eating less red meat. ? Avoiding processed foods.  Drink enough fluid to keep your pee (urine) pale yellow. Managing pain and swelling   Take a warm-water bath (sitz bath) for 20 minutes to ease pain. Do this 3-4 times a day. You may do this in a bathtub or using a portable sitz bath that fits over the toilet.  If told, put ice on the painful area. It may be helpful to use ice between your warm baths. ? Put ice in a plastic bag. ? Place a towel between your skin and the bag. ? Leave the ice on for 20 minutes, 2-3 times a day. General instructions  Take over-the-counter and prescription medicines only as told by your doctor. ? Medicated creams and medicines may be used as told.  Exercise often. Ask your doctor how much and what kind of exercise is best for you.  Go to the bathroom when you have the urge to poop. Do not wait.  Avoid pushing too hard when you poop.  Keep your butt dry and clean. Use wet toilet paper or moist towelettes after pooping.  Do not sit on the toilet for a long time.  Keep all follow-up visits as told by your doctor. This is important. Contact a doctor if you:  Have pain and swelling that do not get better with treatment or medicine.  Have trouble pooping.  Cannot poop.  Have  pain or swelling outside the area of the hemorrhoids. Get help right away if you have:  Bleeding that will not stop. Summary  Hemorrhoids are swollen veins in the butt or around the opening of the butt.  They can cause pain, itching, or bleeding.  Eat foods that have a lot of fiber in them. These include whole grains, beans, nuts, fruits, and vegetables.  Take a warm-water bath (sitz bath) for 20 minutes to ease pain. Do this 3-4 times a day. This information is not intended to replace advice given to you by your health care provider. Make sure you discuss any questions you have with your health care provider. Document Released: 12/18/2007 Document Revised: 07/30/2017 Document Reviewed: 07/30/2017 Elsevier Interactive Patient Education  2019 [employer].

## 2018-05-20 ENCOUNTER — Telehealth: Payer: Self-pay

## 2018-05-20 NOTE — Progress Notes (Signed)
Patient ID: Jonathan Mayer, male   DOB: [birth year], 59 y.o.   MRN: [id number]  Chief Complaint  Patient presents with  . Follow-up    new patient/ hospital follow up Bleeding hemorrhoid    HPI Jonathan Mayer is a 58 y.o. male.   Presents today as follow-up from the emergency department, where he went last night due to hemorrhoidal bleeding.  He states that a number of years ago, he had a thrombosed hemorrhoid that was evacuated by his primary care doctor.  He is also had additional hemorrhoid treatments that sound like banding.  He was in his usual state of health until Saturday, when he became constipated.  He had 2 very hard bowel movements Tuesday and noted that there was blood in the toilet.  He tried conservative measures at home, but was unable to completely stop the bleeding.  He was seen in the emergency department at [facility], but by the time he was seen, the bleeding had stopped.  According to the patient, the provider that saw him reported that the visible hemorrhoid was flat, did not appear thrombosed, and was no longer bleeding.  He has utilized topical Preparation H at home and was also given a prescription for lidocaine gel by the emergency department.  He has noticed additional blood on his toilet paper today as well as worsening perianal pain.  He sought attention in our office today to further evaluate and manage his hemorrhoids.   Past Medical History:  Diagnosis Date  . GERD (gastroesophageal reflux disease)     Past Surgical History:  Procedure Laterality Date  . HERNIA REPAIR    . KNEE ARTHROSCOPY W/ MENISCAL REPAIR Right   . TONSILLECTOMY AND ADENOIDECTOMY      Family History  Problem Relation Age of Onset  . Osteoporosis Mother   . GER disease Father   . Cancer Paternal Uncle   . Hypertension Maternal Grandfather     Social History Social History   Tobacco Use  . Smoking status: Never Smoker  . Smokeless tobacco: Never Used    Substance Use Topics  . Alcohol use: No  . Drug use: No    Allergies  Allergen Reactions  . Aspirin Itching and Other (See Comments)    Eyes swell  . Ibuprofen Itching and Other (See Comments)    Eyes swell    Current Outpatient Medications  Medication Sig Dispense Refill  . esomeprazole (NEXIUM) 40 MG capsule Take 40 mg by mouth daily at 12 noon.    . fluticasone (FLONASE) 50 MCG/ACT nasal spray Place into both nostrils daily.    . hydrocortisone (ANUSOL-HC) 2.5 % rectal cream Apply rectally 2 times daily 30 g 1  . Lidocaine 2 % GEL Apply 1 application topically 3 (three) times daily as needed. 30 g 1   No current facility-administered medications for this visit.     Review of Systems Review of Systems  All other systems reviewed and are negative.   Blood pressure (!) 135/91, pulse 69, temperature 97.7 F (36.5 C), temperature source Temporal, height 5\' 7"  (1.702 m), weight 212 lb 12.8 oz (96.5 kg), SpO2 97 %.  Physical Exam Physical Exam Vitals signs reviewed. Exam conducted with a chaperone present.  Constitutional:      General: He is not in acute distress.    Appearance: Normal appearance.  HENT:     Head: Normocephalic and atraumatic.     Nose: Nose normal.     Mouth/Throat:  Mouth: Mucous membranes are moist.     Pharynx: Oropharynx is clear.  Eyes:     General: No scleral icterus.       Right eye: No discharge.        Left eye: No discharge.     Conjunctiva/sclera: Conjunctivae normal.     Pupils: Pupils are equal, round, and reactive to light.  Neck:     Musculoskeletal: Normal range of motion.  Cardiovascular:     Rate and Rhythm: Normal rate and regular rhythm.     Pulses: Normal pulses.  Pulmonary:     Effort: Pulmonary effort is normal.     Breath sounds: Normal breath sounds.  Abdominal:     General: Bowel sounds are normal.     Palpations: Abdomen is soft.  Genitourinary:    Rectum: External hemorrhoid present.       Comments: The  perineum has blood smeared on the skin.  External exam shows a prolapsed hemorrhoid with a clot extruding from the interior.  No active bleeding. Musculoskeletal: Normal range of motion.        General: No swelling or tenderness.  Lymphadenopathy:     Cervical: No cervical adenopathy.  Skin:    General: Skin is warm and dry.  Neurological:     General: No focal deficit present.     Mental Status: He is alert and oriented to person, place, and time.  Psychiatric:        Mood and Affect: Mood normal.        Behavior: Behavior normal.        Thought Content: Thought content normal.        Judgment: Judgment normal.     Data Reviewed No relevant data  Assessment This is a 58 year old man who has a history of hemorrhoids.  These have been treated variously in the past.  He was seen in the emergency department last night due to bleeding, but by the time he was evaluated, the bleeding had stopped.  Today, there is evidence of thrombosis in a prolapsed external hemorrhoid.  The clot was self evacuating.  Additional gentle pressure completely extruded the clot.  He does have evidence of additional hemorrhoidal tissue in the area.  Plan Today we discussed the various methods of dealing with external hemorrhoids.  He feels like he has failed conservative management with topical agents such as Preparation H and Tucks pads.  He says that normally, he has a fiber rich diet and drinks plenty of water but that he continues to have issues despite this.  I have offered him a hemorrhoidectomy.  We discussed the risks of procedure.  These include but are not limited to bleeding, infection, recurrence, and extreme pain.  He was cautioned that he needed to be extremely proactive and avoiding constipation.  I have recommended that he take an over-the-counter fiber supplement such as Metamucil or Benefiber.  MiraLAX would also be an option.  He does wish to proceed with surgical excision of his hemorrhoids.  We  will schedule this.    Jonathan Mayer 05/20/2018, 11:08 AM

## 2018-05-20 NOTE — Telephone Encounter (Signed)
Patient stated he was having some bleeding from the hemorrhoid. He was instructed to continue the sitz baths and apply cold compress to the area.If bleeding starts and he is able to stop by applying pressure then he is to call office or go to the emergency room.  Hemorrhoidectomy is scheduled for next week.

## 2018-05-20 NOTE — H&P (View-Only) (Signed)
Patient ID: Jonathan Mayer, male   DOB: [birth year], 58 y.o.   MRN: [id number]  Chief Complaint  Patient presents with  . Follow-up    new patient/ hospital follow up Bleeding hemorrhoid    HPI Jonathan Mayer is a 58 y.o. male.   Presents today as follow-up from the emergency department, where he went last night due to hemorrhoidal bleeding.  He states that a number of years ago, he had a thrombosed hemorrhoid that was evacuated by his primary care doctor.  He is also had additional hemorrhoid treatments that sound like banding.  He was in his usual state of health until Saturday, when he became constipated.  He had 2 very hard bowel movements Tuesday and noted that there was blood in the toilet.  He tried conservative measures at home, but was unable to completely stop the bleeding.  He was seen in the emergency department at [facility], but by the time he was seen, the bleeding had stopped.  According to the patient, the provider that saw him reported that the visible hemorrhoid was flat, did not appear thrombosed, and was no longer bleeding.  He has utilized topical Preparation H at home and was also given a prescription for lidocaine gel by the emergency department.  He has noticed additional blood on his toilet paper today as well as worsening perianal pain.  He sought attention in our office today to further evaluate and manage his hemorrhoids.   Past Medical History:  Diagnosis Date  . GERD (gastroesophageal reflux disease)     Past Surgical History:  Procedure Laterality Date  . HERNIA REPAIR    . KNEE ARTHROSCOPY W/ MENISCAL REPAIR Right   . TONSILLECTOMY AND ADENOIDECTOMY      Family History  Problem Relation Age of Onset  . Osteoporosis Mother   . GER disease Father   . Cancer Paternal Uncle   . Hypertension Maternal Grandfather     Social History Social History   Tobacco Use  . Smoking status: Never Smoker  . Smokeless tobacco: Never Used    Substance Use Topics  . Alcohol use: No  . Drug use: No    Allergies  Allergen Reactions  . Aspirin Itching and Other (See Comments)    Eyes swell  . Ibuprofen Itching and Other (See Comments)    Eyes swell    Current Outpatient Medications  Medication Sig Dispense Refill  . esomeprazole (NEXIUM) 40 MG capsule Take 40 mg by mouth daily at 12 noon.    . fluticasone (FLONASE) 50 MCG/ACT nasal spray Place into both nostrils daily.    . hydrocortisone (ANUSOL-HC) 2.5 % rectal cream Apply rectally 2 times daily 30 g 1  . Lidocaine 2 % GEL Apply 1 application topically 3 (three) times daily as needed. 30 g 1   No current facility-administered medications for this visit.     Review of Systems Review of Systems  All other systems reviewed and are negative.   Blood pressure (!) 135/91, pulse 69, temperature 97.7 F (36.5 C), temperature source Temporal, height 5\' 7"  (1.702 m), weight 212 lb 12.8 oz (96.5 kg), SpO2 97 %.  Physical Exam Physical Exam Vitals signs reviewed. Exam conducted with a chaperone present.  Constitutional:      General: He is not in acute distress.    Appearance: Normal appearance.  HENT:     Head: Normocephalic and atraumatic.     Nose: Nose normal.     Mouth/Throat:  Mouth: Mucous membranes are moist.     Pharynx: Oropharynx is clear.  Eyes:     General: No scleral icterus.       Right eye: No discharge.        Left eye: No discharge.     Conjunctiva/sclera: Conjunctivae normal.     Pupils: Pupils are equal, round, and reactive to light.  Neck:     Musculoskeletal: Normal range of motion.  Cardiovascular:     Rate and Rhythm: Normal rate and regular rhythm.     Pulses: Normal pulses.  Pulmonary:     Effort: Pulmonary effort is normal.     Breath sounds: Normal breath sounds.  Abdominal:     General: Bowel sounds are normal.     Palpations: Abdomen is soft.  Genitourinary:    Rectum: External hemorrhoid present.       Comments: The  perineum has blood smeared on the skin.  External exam shows a prolapsed hemorrhoid with a clot extruding from the interior.  No active bleeding. Musculoskeletal: Normal range of motion.        General: No swelling or tenderness.  Lymphadenopathy:     Cervical: No cervical adenopathy.  Skin:    General: Skin is warm and dry.  Neurological:     General: No focal deficit present.     Mental Status: He is alert and oriented to person, place, and time.  Psychiatric:        Mood and Affect: Mood normal.        Behavior: Behavior normal.        Thought Content: Thought content normal.        Judgment: Judgment normal.     Data Reviewed No relevant data  Assessment This is a 58 year old man who has a history of hemorrhoids.  These have been treated variously in the past.  He was seen in the emergency department last night due to bleeding, but by the time he was evaluated, the bleeding had stopped.  Today, there is evidence of thrombosis in a prolapsed external hemorrhoid.  The clot was self evacuating.  Additional gentle pressure completely extruded the clot.  He does have evidence of additional hemorrhoidal tissue in the area.  Plan Today we discussed the various methods of dealing with external hemorrhoids.  He feels like he has failed conservative management with topical agents such as Preparation H and Tucks pads.  He says that normally, he has a fiber rich diet and drinks plenty of water but that he continues to have issues despite this.  I have offered him a hemorrhoidectomy.  We discussed the risks of procedure.  These include but are not limited to bleeding, infection, recurrence, and extreme pain.  He was cautioned that he needed to be extremely proactive and avoiding constipation.  I have recommended that he take an over-the-counter fiber supplement such as Metamucil or Benefiber.  MiraLAX would also be an option.  He does wish to proceed with surgical excision of his hemorrhoids.  We  will schedule this.    Jonathan Mayer 05/20/2018, 11:08 AM

## 2018-05-21 ENCOUNTER — Other Ambulatory Visit: Payer: Self-pay

## 2018-05-21 ENCOUNTER — Encounter
Admission: RE | Admit: 2018-05-21 | Discharge: 2018-05-21 | Disposition: A | Discharge status: Home / Self Care | Payer: 59 | Source: Ambulatory Visit | Attending: General Surgery | Admitting: General Surgery

## 2018-05-21 HISTORY — DX: Family history of other specified conditions: Z84.89

## 2018-05-21 HISTORY — DX: Pneumonia, unspecified organism: J18.9

## 2018-05-21 HISTORY — DX: Unspecified osteoarthritis, unspecified site: M19.90

## 2018-05-21 HISTORY — DX: Headache: R51

## 2018-05-21 HISTORY — DX: Headache, unspecified: R51.9

## 2018-05-21 NOTE — Patient Instructions (Signed)
Your procedure is scheduled on: 05-25-18 Report to Same Day Surgery 2nd floor medical mall [facility] Entrance-take elevator on left to 2nd floor.  Check in with surgery information desk.) To find out your arrival time please call [phone] between 1PM - 3PM on 05-24-18  Remember: Instructions that are not followed completely may result in serious medical risk, up to and including death, or upon the discretion of your surgeon and anesthesiologist your surgery may need to be rescheduled.    _x___ 1. Do not eat food after midnight the night before your procedure. You may drink clear liquids up to 2 hours before you are scheduled to arrive at the hospital for your procedure.  Do not drink clear liquids within 2 hours of your scheduled arrival to the hospital.  Clear liquids include  --Water or Apple juice without pulp  --Clear carbohydrate beverage such as ClearFast or Gatorade  --Black Coffee or Clear Tea (No milk, no creamers, do not add anything to the coffee or Tea   ____Ensure clear carbohydrate drink on the way to the hospital for bariatric patients  ____Ensure clear carbohydrate drink 3 hours before surgery for Dr Dwyane Luo patients if physician instructed.   No gum chewing or hard candies.     __x__ 2. No Alcohol for 24 hours before or after surgery.   __x__3. No Smoking or e-cigarettes for 24 prior to surgery.  Do not use any chewable tobacco products for at least 6 hour prior to surgery   ____  4. Bring all medications with you on the day of surgery if instructed.    __x__ 5. Notify your doctor if there is any change in your medical condition     (cold, fever, infections).    x___6. On the morning of surgery brush your teeth with toothpaste and water.  You may rinse your mouth with mouth wash if you wish.  Do not swallow any toothpaste or mouthwash.   Do not wear jewelry, make-up, hairpins, clips or nail polish.  Do not wear lotions, powders, or perfumes. You may wear  deodorant.  Do not shave 48 hours prior to surgery. Men may shave face and neck.  Do not bring valuables to the hospital.    [facility] is not responsible for any belongings or valuables.               Contacts, dentures or bridgework may not be worn into surgery.  Leave your suitcase in the car. After surgery it may be brought to your room.  For patients admitted to the hospital, discharge time is determined by your treatment team.  _  Patients discharged the day of surgery will not be allowed to drive home.  You will need someone to drive you home and stay with you the night of your procedure.    Please read over the following fact sheets that you were given:   [facility] Preparing for Surgery and or MRSA Information   _x___ TAKE THE FOLLOWING MEDICATION THE MORNING OF SURGERY WITH A SMALL SIP OF WATER. These include:  1. [place]  2. MAGNESIUM OXIDE  3.  4.  5.  6.  _X___Fleets enema as directed-FLEET ENEMA TO BE DONE THE NIGHT BEFORE SURGERY AND THE OTHER I HOUR PRIOR TO ARRIVAL TIME THE MORNING OF SURGERY  ____ Use CHG Soap or sage wipes as directed on instruction sheet   ____ Use inhalers on the day of surgery and bring to hospital day of surgery  ____  Stop Metformin and Janumet 2 days prior to surgery.    ____ Take 1/2 of usual insulin dose the night before surgery and none on the morning surgery.   ____ Follow recommendations from Cardiologist, Pulmonologist or PCP regarding stopping Aspirin, Coumadin, Plavix ,Eliquis, Effient, or Pradaxa, and Pletal.  X____Stop Anti-inflammatories such as Advil, Aleve, Ibuprofen, Motrin, Naproxen, Naprosyn, Goodies powders or aspirin products NOW-OK to take Tylenol    _x___ Stop supplements until after surgery-STOP OMEGA 3 KRILL OIL NOW   ____ Bring C-Pap to the hospital.

## 2018-05-24 ENCOUNTER — Encounter: Payer: Self-pay | Admitting: Anesthesiology

## 2018-05-25 ENCOUNTER — Ambulatory Visit
Admission: RE | Admit: 2018-05-25 | Discharge: 2018-05-25 | Disposition: A | Discharge status: Home / Self Care | Payer: 59 | Attending: General Surgery | Admitting: General Surgery

## 2018-05-25 ENCOUNTER — Other Ambulatory Visit: Payer: Self-pay

## 2018-05-25 ENCOUNTER — Ambulatory Visit: Payer: 59 | Admitting: Anesthesiology

## 2018-05-25 ENCOUNTER — Encounter: Payer: Self-pay | Admitting: Anesthesiology

## 2018-05-25 ENCOUNTER — Inpatient Hospital Stay: Payer: Self-pay | Admitting: Family Medicine

## 2018-05-25 ENCOUNTER — Telehealth: Payer: Self-pay | Admitting: General Surgery

## 2018-05-25 ENCOUNTER — Encounter
Admission: RE | Disposition: A | Discharge status: Home / Self Care | Payer: Self-pay | Source: Home / Self Care | Attending: General Surgery

## 2018-05-25 ENCOUNTER — Other Ambulatory Visit: Payer: Self-pay | Admitting: General Surgery

## 2018-05-25 DIAGNOSIS — E669 Obesity, unspecified: Secondary | ICD-10-CM | POA: Diagnosis not present

## 2018-05-25 DIAGNOSIS — Z79899 Other long term (current) drug therapy: Secondary | ICD-10-CM | POA: Insufficient documentation

## 2018-05-25 DIAGNOSIS — D649 Anemia, unspecified: Secondary | ICD-10-CM | POA: Insufficient documentation

## 2018-05-25 DIAGNOSIS — K644 Residual hemorrhoidal skin tags: Secondary | ICD-10-CM | POA: Diagnosis not present

## 2018-05-25 DIAGNOSIS — M1990 Unspecified osteoarthritis, unspecified site: Secondary | ICD-10-CM | POA: Diagnosis not present

## 2018-05-25 DIAGNOSIS — R51 Headache: Secondary | ICD-10-CM | POA: Diagnosis not present

## 2018-05-25 DIAGNOSIS — K645 Perianal venous thrombosis: Secondary | ICD-10-CM | POA: Diagnosis not present

## 2018-05-25 DIAGNOSIS — K648 Other hemorrhoids: Secondary | ICD-10-CM | POA: Diagnosis not present

## 2018-05-25 DIAGNOSIS — K219 Gastro-esophageal reflux disease without esophagitis: Secondary | ICD-10-CM | POA: Insufficient documentation

## 2018-05-25 HISTORY — PX: HEMORRHOID SURGERY: SHX153

## 2018-05-25 SURGERY — HEMORRHOIDECTOMY
Anesthesia: General

## 2018-05-25 MED ORDER — ACETAMINOPHEN 500 MG PO TABS
ORAL_TABLET | ORAL | Status: AC
  Administered 2018-05-25: 1000 mg via ORAL
  Filled 2018-05-25: qty 2

## 2018-05-25 MED ORDER — ROCURONIUM BROMIDE 100 MG/10ML IV SOLN
INTRAVENOUS | Status: DC | PRN
  Administered 2018-05-25: 30 mg via INTRAVENOUS

## 2018-05-25 MED ORDER — ONDANSETRON HCL 4 MG/2ML IJ SOLN: 4.0000 mg | Freq: Once | INTRAMUSCULAR | Status: DC | PRN

## 2018-05-25 MED ORDER — BUPIVACAINE LIPOSOME 1.3 % IJ SUSP
INTRAMUSCULAR | Status: AC
  Filled 2018-05-25: qty 20

## 2018-05-25 MED ORDER — FLEET ENEMA 7-19 GM/118ML RE ENEM
1.0000 | ENEMA | Freq: Once | RECTAL | Status: AC
  Administered 2018-05-25: 1 via RECTAL

## 2018-05-25 MED ORDER — OXYCODONE HCL 5 MG PO TABS: 5.0000 mg | ORAL_TABLET | ORAL | 0 refills | Status: DC | PRN

## 2018-05-25 MED ORDER — THROMBIN 5000 UNITS EX SOLR
CUTANEOUS | Status: AC
  Filled 2018-05-25: qty 5000

## 2018-05-25 MED ORDER — PROPOFOL 500 MG/50ML IV EMUL
INTRAVENOUS | Status: AC
  Filled 2018-05-25: qty 50

## 2018-05-25 MED ORDER — LIDOCAINE HCL (CARDIAC) PF 100 MG/5ML IV SOSY
PREFILLED_SYRINGE | INTRAVENOUS | Status: DC | PRN
  Administered 2018-05-25: 100 mg via INTRAVENOUS

## 2018-05-25 MED ORDER — FENTANYL CITRATE (PF) 100 MCG/2ML IJ SOLN
INTRAMUSCULAR | Status: DC | PRN
  Administered 2018-05-25: 50 ug via INTRAVENOUS

## 2018-05-25 MED ORDER — FENTANYL CITRATE (PF) 100 MCG/2ML IJ SOLN
INTRAMUSCULAR | Status: AC
  Filled 2018-05-25: qty 2

## 2018-05-25 MED ORDER — ACETAMINOPHEN 500 MG PO TABS
1000.0000 mg | ORAL_TABLET | ORAL | Status: AC
  Administered 2018-05-25: 1000 mg via ORAL

## 2018-05-25 MED ORDER — GELATIN ABSORBABLE 100 CM EX MISC
CUTANEOUS | Status: AC
  Filled 2018-05-25: qty 1

## 2018-05-25 MED ORDER — LACTATED RINGERS IV SOLN
INTRAVENOUS | Status: DC
  Administered 2018-05-25: 08:00:00 via INTRAVENOUS

## 2018-05-25 MED ORDER — FLEET ENEMA 7-19 GM/118ML RE ENEM: 1.0000 | ENEMA | Freq: Once | RECTAL | Status: DC

## 2018-05-25 MED ORDER — DEXAMETHASONE SODIUM PHOSPHATE 10 MG/ML IJ SOLN
INTRAMUSCULAR | Status: DC | PRN
  Administered 2018-05-25: 5 mg via INTRAVENOUS

## 2018-05-25 MED ORDER — FENTANYL CITRATE (PF) 100 MCG/2ML IJ SOLN: 25.0000 ug | INTRAMUSCULAR | Status: DC | PRN

## 2018-05-25 MED ORDER — MIDAZOLAM HCL 2 MG/2ML IJ SOLN
INTRAMUSCULAR | Status: AC
  Filled 2018-05-25: qty 2

## 2018-05-25 MED ORDER — MIDAZOLAM HCL 2 MG/2ML IJ SOLN
INTRAMUSCULAR | Status: DC | PRN
  Administered 2018-05-25: 2 mg via INTRAVENOUS

## 2018-05-25 MED ORDER — SUGAMMADEX SODIUM 500 MG/5ML IV SOLN
INTRAVENOUS | Status: DC | PRN
  Administered 2018-05-25: 200 mg via INTRAVENOUS

## 2018-05-25 MED ORDER — PROPOFOL 10 MG/ML IV BOLUS
INTRAVENOUS | Status: DC | PRN
  Administered 2018-05-25: 50 mg via INTRAVENOUS
  Administered 2018-05-25: 100 mg via INTRAVENOUS

## 2018-05-25 MED ORDER — BUPIVACAINE LIPOSOME 1.3 % IJ SUSP
INTRAMUSCULAR | Status: DC | PRN
  Administered 2018-05-25: 20 mL

## 2018-05-25 MED ORDER — LACTATED RINGERS IV SOLN
INTRAVENOUS | Status: DC | PRN
  Administered 2018-05-25: 09:00:00 via INTRAVENOUS

## 2018-05-25 MED ORDER — ONDANSETRON HCL 4 MG/2ML IJ SOLN
INTRAMUSCULAR | Status: DC | PRN
  Administered 2018-05-25: 4 mg via INTRAVENOUS

## 2018-05-25 MED ORDER — ONDANSETRON HCL 4 MG/2ML IJ SOLN
INTRAMUSCULAR | Status: AC
  Filled 2018-05-25: qty 2

## 2018-05-25 SURGICAL SUPPLY — 27 items
BRIEF STRETCH MATERNITY 2XLG (MISCELLANEOUS) ×2 IMPLANT
CANISTER SUCT 1200ML W/VALVE (MISCELLANEOUS) ×2 IMPLANT
COVER WAND RF STERILE (DRAPES) ×1 IMPLANT
DRAPE LAPAROTOMY 100X77 ABD (DRAPES) ×2 IMPLANT
DRAPE LEGGINS SURG 28X43 STRL (DRAPES) ×1 IMPLANT
DRAPE UNDER BUTTOCK W/FLU (DRAPES) ×1 IMPLANT
DRSG GAUZE FLUFF 36X18 (GAUZE/BANDAGES/DRESSINGS) ×2 IMPLANT
ELECT CAUTERY BLADE TIP 2.5 (TIP) ×2
ELECT REM PT RETURN 9FT ADLT (ELECTROSURGICAL) ×2
ELECTRODE CAUTERY BLDE TIP 2.5 (TIP) ×1 IMPLANT
ELECTRODE REM PT RTRN 9FT ADLT (ELECTROSURGICAL) ×1 IMPLANT
GOWN STRL REUS W/ TWL LRG LVL3 (GOWN DISPOSABLE) ×2 IMPLANT
GOWN STRL REUS W/TWL LRG LVL3 (GOWN DISPOSABLE) ×3
HARMONIC SCALPEL FOCUS (MISCELLANEOUS) ×2 IMPLANT
KIT TURNOVER KIT A (KITS) ×2 IMPLANT
LABEL OR SOLS (LABEL) ×2 IMPLANT
NDL HYPO 25X1 1.5 SAFETY (NEEDLE) ×1 IMPLANT
NEEDLE HYPO 25X1 1.5 SAFETY (NEEDLE) ×2 IMPLANT
PACK BASIN MINOR ARMC (MISCELLANEOUS) ×2 IMPLANT
PAD OB MATERNITY 4.3X12.25 (PERSONAL CARE ITEMS) ×2 IMPLANT
PAD PREP 24X41 OB/GYN DISP (PERSONAL CARE ITEMS) ×1 IMPLANT
SPONGE LAP 18X18 RF (DISPOSABLE) ×2 IMPLANT
SURGILUBE 2OZ TUBE FLIPTOP (MISCELLANEOUS) ×2 IMPLANT
SUT SILK 0 CT 1 30 (SUTURE) ×2 IMPLANT
SUT VIC AB 3-0 SH 27 (SUTURE) ×1
SUT VIC AB 3-0 SH 27X BRD (SUTURE) IMPLANT
SYR 10ML LL (SYRINGE) ×2 IMPLANT

## 2018-05-25 NOTE — Progress Notes (Signed)
[place] visited w/ pt pre-op w/ wife present. Pt gave a review of his life and the challenges he has had to endure related to his father. Ch saw this as a moment of release for the pt of any emotional distress that may cause worry before the procedure. Ch joked w/ pt and wife and shared words of comfort and affirmation of his faith. Ch prayed w/ pt and wife. Pt was tearful yet thankful for the visit. No further needs at this time.     05/25/18 0800  Clinical Encounter Type  Visited With Patient and family together  Visit Type Psychological support;Spiritual support;Social support;Pre-op  Spiritual Encounters  Spiritual Needs Prayer;Emotional;Grief support  Stress Factors  Patient Stress Factors Health changes  Family Stress Factors None identified

## 2018-05-25 NOTE — Anesthesia Postprocedure Evaluation (Signed)
Anesthesia Post Note  Patient: Jonathan Mayer  Procedure(s) Performed: HEMORRHOIDECTOMY (N/A )  Patient location during evaluation: PACU Anesthesia Type: General Level of consciousness: awake and alert Pain management: pain level controlled Vital Signs Assessment: post-procedure vital signs reviewed and stable Respiratory status: spontaneous breathing, nonlabored ventilation, respiratory function stable and patient connected to nasal cannula oxygen Cardiovascular status: blood pressure returned to baseline and stable Postop Assessment: no apparent nausea or vomiting Anesthetic complications: no     Last Vitals:  Vitals:   05/25/18 1024 05/25/18 1126  BP: 130/70 120/69  Pulse: 63 66  Resp: 18 18  Temp: 36.4 C 36.7 C  SpO2: 100% 95%    Last Pain:  Vitals:   05/25/18 1126  TempSrc: Oral  PainSc: 0-No pain                 Fianna Snowball S

## 2018-05-25 NOTE — Discharge Instructions (Signed)
High-Fiber Diet Fiber, also called dietary fiber, is a type of carbohydrate that is found in fruits, vegetables, whole grains, and beans. A high-fiber diet can have many health benefits. Your health care provider may recommend a high-fiber diet to help:  Prevent constipation. Fiber can make your bowel movements more regular.  Lower your cholesterol.  Relieve the following conditions: ? Swelling of veins in the anus (hemorrhoids). ? Swelling and irritation (inflammation) of specific areas of the digestive tract (uncomplicated diverticulosis). ? A problem of the large intestine (colon) that sometimes causes pain and diarrhea (irritable bowel syndrome, IBS).  Prevent overeating as part of a weight-loss plan.  Prevent heart disease, type 2 diabetes, and certain cancers. What is my plan? The recommended daily fiber intake in grams (g) includes:  38 g for men age 60 or younger.  30 g for men over age 55.  45 g for women age 54 or younger.  21 g for women over age 81. You can get the recommended daily intake of dietary fiber by:  Eating a variety of fruits, vegetables, grains, and beans.  Taking a fiber supplement, if it is not possible to get enough fiber through your diet. What do I need to know about a high-fiber diet?  It is better to get fiber through food sources rather than from fiber supplements. There is not a lot of research about how effective supplements are.  Always check the fiber content on the nutrition facts label of any prepackaged food. Look for foods that contain 5 g of fiber or more per serving.  Talk with a diet and nutrition specialist (dietitian) if you have questions about specific foods that are recommended or not recommended for your medical condition, especially if those foods are not listed below.  Gradually increase how much fiber you consume. If you increase your intake of dietary fiber too quickly, you may have bloating, cramping, or gas.  Drink plenty  of water. Water helps you to digest fiber. What are tips for following this plan?  Eat a wide variety of high-fiber foods.  Make sure that half of the grains that you eat each day are whole grains.  Eat breads and cereals that are made with whole-grain flour instead of refined flour or white flour.  Eat Abe rice, bulgur wheat, or millet instead of white rice.  Start the day with a breakfast that is high in fiber, such as a cereal that contains 5 g of fiber or more per serving.  Use beans in place of meat in soups, salads, and pasta dishes.  Eat high-fiber snacks, such as berries, raw vegetables, nuts, and popcorn.  Choose whole fruits and vegetables instead of processed forms like juice or sauce. What foods can I eat?  Fruits Berries. Pears. Apples. Oranges. Avocado. Prunes and raisins. Dried figs. Vegetables Sweet potatoes. Spinach. Kale. Artichokes. Cabbage. Broccoli. Cauliflower. Green peas. Carrots. Squash. Grains Whole-grain breads. Multigrain cereal. Oats and oatmeal. Monica rice. Barley. Bulgur wheat. Garden. Quinoa. Bran muffins. Popcorn. Rye wafer crackers. Meats and other proteins Navy, kidney, and pinto beans. Soybeans. Split peas. Lentils. Nuts and seeds. Dairy Fiber-fortified yogurt. Beverages Fiber-fortified soy milk. Fiber-fortified orange juice. Other foods Fiber bars. The items listed above may not be a complete list of recommended foods and beverages. Contact a dietitian for more options. What foods are not recommended? Fruits Fruit juice. Cooked, strained fruit. Vegetables Fried potatoes. Canned vegetables. Well-cooked vegetables. Grains White bread. Pasta made with refined flour. White rice. Meats and other  proteins Fatty cuts of meat. Fried chicken or fried fish. Dairy Milk. Yogurt. Cream cheese. Sour cream. Fats and oils Butters. Beverages Soft drinks. Other foods Cakes and pastries. The items listed above may not be a complete list of foods  and beverages to avoid. Contact a dietitian for more information. Summary  Fiber is a type of carbohydrate. It is found in fruits, vegetables, whole grains, and beans.  There are many health benefits of eating a high-fiber diet, such as preventing constipation, lowering blood cholesterol, helping with weight loss, and reducing your risk of heart disease, diabetes, and certain cancers.  Gradually increase your intake of fiber. Increasing too fast can result in cramping, bloating, and gas. Drink plenty of water while you increase your fiber.  The best sources of fiber include whole fruits and vegetables, whole grains, nuts, seeds, and beans. This information is not intended to replace advice given to you by your health care provider. Make sure you discuss any questions you have with your health care provider.   AMBULATORY SURGERY  DISCHARGE INSTRUCTIONS   1) The drugs that you were given will stay in your system until tomorrow so for the next 24 hours you should not:  A) Drive an automobile B) Make any legal decisions C) Drink any alcoholic beverage   2) You may resume regular meals tomorrow.  Today it is better to start with liquids and gradually work up to solid foods.  You may eat anything you prefer, but it is better to start with liquids, then soup and crackers, and gradually work up to solid foods.   3) Please notify your doctor immediately if you have any unusual bleeding, trouble breathing, redness and pain at the surgery site, drainage, fever, or pain not relieved by medication.    4) Additional Instructions:        Please contact your physician with any problems or Same Day Surgery at [phone], Monday through Friday 6 am to 4 pm, or [place] [facility] number at [phone]. Document Released: 03/10/2005 Document Revised: 01/12/2017 Document Reviewed: 01/12/2017 Elsevier Interactive Patient Education  2019 [employer].

## 2018-05-25 NOTE — Op Note (Signed)
Operative Note  Preoperative Diagnosis:  hemorrhoids  Postoperative Diagnosis:  same  Operation:  Single column hemorroidectomy  Surgeon: [name], MD  Assistant: none  Anesthesia: GETA  Findings: dilated left lateral column hemorrhoidal veins with evidence of recent thrombosis.  Additional hemorrhoids identified in posterior and right-lateral columns  Indications: Jonathan Mayer is a 58 year old man who has a longstanding history of hemorrhoids.  He has had worsening symptoms of pain and bleeding.  He was seen recently in the emergency department for a thrombosed external hemorrhoid.  He desires surgical hemorrhoidectomy.  Procedure In Detail: The patient was identified in the preoperative holding room and brought to the operating room where he was intubated on his stretcher.  He was then turned into the prone position.  Care was taken to appropriately pad and support all extremities and bony prominences.  The table was flexed into the prone jackknife position.  The buttocks were taped laterally to provide exposure.  The peri-area was then prepped and draped in standard fashion.  A timeout was performed confirming the patient's identity the procedure being performed his allergies all necessary equipment was available and that maintenance anesthesia was in use.  I performed a digital rectal exam.  I did not identify any masses or areas of fluctuance.  A Hill-Ferguson retractor was placed into the anal canal.  Patient has circumferential dilated hemorrhoidal veins however those in the left lateral column were by far the largest and most likely to be symptomatic.  There was evidence of recent thrombosis as well as some bleeding seen in these vessels.  The hemorrhoidal column was grasped with Babcock clamps.  The harmonic scalpel was used to elevate the vessels off of the underlying musculature and divide the tissue between them.  The left lateral hemorrhoidal column was completely excised and  handed off as a specimen.  Good hemostasis was present.  Exparel was injected to provide postoperative pain control.  Patient was then turned into the supine position, awakened, and taken to the postanesthesia care unit in good condition.  EBL: <5cc  IVF: 700 cc crystalloid  Specimen(s): left lateral hemorrhoidal column  Complications: none immediately apparent.   Counts: all needles, instruments, and sponges were counted and reported to be correct in number at the end of the case.   I was present for and participated in the entire operation.  [name]  9:48 AM

## 2018-05-25 NOTE — Anesthesia Procedure Notes (Signed)
Procedure Name: Intubation Date/Time: 05/25/2018 8:54 AM Performed by: Justus Memory, CRNA Pre-anesthesia Checklist: Patient identified, Patient being monitored, Timeout performed, Emergency Drugs available and Suction available Patient Re-evaluated:Patient Re-evaluated prior to induction Oxygen Delivery Method: Circle system utilized Preoxygenation: Pre-oxygenation with 100% oxygen Induction Type: IV induction Ventilation: Mask ventilation without difficulty Laryngoscope Size: Mac and 3 Grade View: Grade II Tube type: Oral Tube size: 7.0 mm Number of attempts: 1 Airway Equipment and Method: Stylet Placement Confirmation: ETT inserted through vocal cords under direct vision,  positive ETCO2 and breath sounds checked- equal and bilateral Secured at: 21 cm Tube secured with: Tape Dental Injury: Teeth and Oropharynx as per pre-operative assessment

## 2018-05-25 NOTE — Transfer of Care (Signed)
Immediate Anesthesia Transfer of Care Note  Patient: Jonathan Mayer  Procedure(s) Performed: HEMORRHOIDECTOMY (N/A )  Patient Location: PACU  Anesthesia Type:General  Level of Consciousness: sedated  Airway & Oxygen Therapy: Patient Spontanous Breathing and Patient connected to face mask oxygen  Post-op Assessment: Report given to RN and Post -op Vital signs reviewed and stable  Post vital signs: Reviewed and stable  Last Vitals:  Vitals Value Taken Time  BP 145/76 05/25/2018  9:45 AM  Temp 36.3 C 05/25/2018  9:45 AM  Pulse 64 05/25/2018  9:58 AM  Resp 14 05/25/2018  9:58 AM  SpO2 100 % 05/25/2018  9:58 AM  Vitals shown include unvalidated device data.  Last Pain:  Vitals:   05/25/18 0945  TempSrc:   PainSc: 0-No pain         Complications: No apparent anesthesia complications

## 2018-05-25 NOTE — Anesthesia Post-op Follow-up Note (Signed)
Anesthesia QCDR form completed.        

## 2018-05-25 NOTE — Interval H&P Note (Signed)
History and Physical Interval Note:  05/25/2018 8:37 AM  Jonathan Mayer  has presented today for surgery, with the diagnosis of THROMBOSED HEMORRHOID  The various methods of treatment have been discussed with the patient and family. After consideration of risks, benefits and other options for treatment, the patient has consented to  Procedure(s): HEMORRHOIDECTOMY (N/A) as a surgical intervention .  The patient's history has been reviewed, patient examined, no change in status, stable for surgery.  I have reviewed the patient's chart and labs.  Questions were answered to the patient's satisfaction.     [name]

## 2018-05-25 NOTE — Anesthesia Preprocedure Evaluation (Signed)
Anesthesia Evaluation  Patient identified by MRN, date of birth, ID band Patient awake    Reviewed: Allergy & Precautions, NPO status , Patient's Chart, lab work & pertinent test results, reviewed documented beta blocker date and time   History of Anesthesia Complications (+) Family history of anesthesia reaction  Airway Mallampati: III  TM Distance: >3 FB     Dental  (+) Chipped   Pulmonary pneumonia, resolved,           Cardiovascular      Neuro/Psych  Headaches,    GI/Hepatic GERD  Controlled,  Endo/Other    Renal/GU      Musculoskeletal  (+) Arthritis ,   Abdominal   Peds  Hematology  (+) anemia ,   Anesthesia Other Findings Obese.   Reproductive/Obstetrics                             Anesthesia Physical Anesthesia Plan  ASA: III  Anesthesia Plan: General   Post-op Pain Management:    Induction: Intravenous  PONV Risk Score and Plan:   Airway Management Planned: Oral ETT and LMA  Additional Equipment:   Intra-op Plan:   Post-operative Plan:   Informed Consent: I have reviewed the patients History and Physical, chart, labs and discussed the procedure including the risks, benefits and alternatives for the proposed anesthesia with the patient or authorized representative who has indicated his/her understanding and acceptance.       Plan Discussed with: CRNA  Anesthesia Plan Comments:         Anesthesia Quick Evaluation

## 2018-05-25 NOTE — Telephone Encounter (Signed)
Notified patient RX sent to [facility].

## 2018-05-25 NOTE — Telephone Encounter (Signed)
[employer] is calling phone number [phone] claim # [id number] they are needing some documents  on the patient. Please call and advise.

## 2018-05-25 NOTE — Telephone Encounter (Signed)
Patient was discharged today from outpatient surgery with Dr Celine Ahr- hemorrhoidectomy.   Patient was prescribed oxycodone 5 MG - every 4 hours PRN for severe pain. [facility] [place]   Patient's wife has called the office and stated that [facility] [place] did not have this medication in stock, however the [facility] [address] could fill this.   Please place an order for the medication to the [facility] in [place].   Contact patient's wife on the home phone once this is complete.

## 2018-05-26 ENCOUNTER — Ambulatory Visit: Payer: 59 | Admitting: General Surgery

## 2018-05-26 LAB — SURGICAL PATHOLOGY

## 2018-06-09 ENCOUNTER — Other Ambulatory Visit: Payer: Self-pay

## 2018-06-09 ENCOUNTER — Ambulatory Visit (INDEPENDENT_AMBULATORY_CARE_PROVIDER_SITE_OTHER): Payer: 59 | Admitting: General Surgery

## 2018-06-09 ENCOUNTER — Encounter: Payer: Self-pay | Admitting: General Surgery

## 2018-06-09 VITALS — BP 138/92 | HR 78 | Temp 97.9°F | Resp 14 | Ht 67.0 in | Wt 214.8 lb

## 2018-06-09 DIAGNOSIS — K649 Unspecified hemorrhoids: Secondary | ICD-10-CM

## 2018-06-09 NOTE — Patient Instructions (Addendum)
The patient is aware to call back for any questions or new concerns.  May use preparation H or Tucks pads as needed to rectal area May use Miralax as needed to avoid constipation  May return to work as planned 06-16-18  Constipation, Adult Constipation is when a person:  Poops (has a bowel movement) fewer times in a week than normal.  Has a hard time pooping.  Has poop that is dry, hard, or bigger than normal. Follow these instructions at home: Eating and drinking   Eat foods that have a lot of fiber, such as: ? Fresh fruits and vegetables. ? Whole grains. ? Beans.  Eat less of foods that are high in fat, low in fiber, or overly processed, such as: ? [place] fries. ? Hamburgers. ? Cookies. ? Candy. ? Soda.  Drink enough fluid to keep your pee (urine) clear or pale yellow. General instructions  Exercise regularly or as told by your doctor.  Go to the restroom when you feel like you need to poop. Do not hold it in.  Take over-the-counter and prescription medicines only as told by your doctor. These include any fiber supplements.  Do pelvic floor retraining exercises, such as: ? Doing deep breathing while relaxing your lower belly (abdomen). ? Relaxing your pelvic floor while pooping.  Watch your condition for any changes.  Keep all follow-up visits as told by your doctor. This is important. Contact a doctor if:  You have pain that gets worse.  You have a fever.  You have not pooped for 4 days.  You throw up (vomit).  You are not hungry.  You lose weight.  You are bleeding from the anus.  You have thin, pencil-like poop (stool). Get help right away if:  You have a fever, and your symptoms suddenly get worse.  You leak poop or have blood in your poop.  Your belly feels hard or bigger than normal (is bloated).  You have very bad belly pain.  You feel dizzy or you faint. This information is not intended to replace advice given to you by your health  care provider. Make sure you discuss any questions you have with your health care provider. Document Released: 08/27/2007 Document Revised: 09/28/2015 Document Reviewed: 08/29/2015 Elsevier Interactive Patient Education  2019 [employer].

## 2018-06-09 NOTE — Progress Notes (Signed)
Faizon Capozzi returns to clinic today after undergoing an uncomplicated hemorrhoidectomy on 25 May 2018.  He states that he has done fairly well since his operation.  He has not required much in the way of pain medication.  He did report that his first full bowel movement was fairly painful, but this was controlled with Aleve.  He has again become constipated, however.  He is taking Colace, prune juice, psyllium fiber, and a fiber bar.  Despite this, he says that he has developed another enlarged hemorrhoid.  Vitals:   06/09/18 1359  BP: (!) 138/92  Pulse: 78  Resp: 14  Temp: 97.9 F (36.6 C)  SpO2: 95%   Focused examination: There is a small amount of mucus discharge present at the anal opening.  The surgical site is clean without erythema, induration, or purulent drainage.  There is, a dilated hemorrhoidal vein in the right lateral column.  It is not prolapsed, nor is it thrombosed.  Assessment and plan: Halil Rentz is a 58 year old man who underwent an uncomplicated open hemorrhoidectomy.  He is healing well from this procedure, but has developed an enlarged hemorrhoid in a second column.  For now, he should continue sitz baths as able, use of over-the-counter agents such as Preparation H, Preparation H suppositories, moistened towelettes for cleansing, and/or Tucks pads.  He is free to resume his usual duties at work.  I would like to see him back in 2 months time to reassess the latest hemorrhoid for response to conservative treatment.  He may require a second operation.  I encouraged him to start using MiraLAX in addition to the other agents to avoid any straining whatsoever during bowel movements.

## 2018-06-11 ENCOUNTER — Telehealth: Payer: Self-pay

## 2018-06-11 NOTE — Telephone Encounter (Signed)
Patient states that his return to work date is now 06/21/18. He will contact [place] and let them know of the change and have them send [place] new paperwork so the new date may be added.

## 2018-06-23 NOTE — Progress Notes (Signed)
Patients updated disability paper work has been completed and faxed over, copy placed in the scan box and complete folder.

## 2018-08-09 ENCOUNTER — Encounter: Payer: 59 | Admitting: General Surgery

## 2019-02-08 ENCOUNTER — Emergency Department: Payer: No Typology Code available for payment source

## 2019-02-08 ENCOUNTER — Encounter: Payer: Self-pay | Admitting: Emergency Medicine

## 2019-02-08 ENCOUNTER — Ambulatory Visit
Admission: EM | Admit: 2019-02-08 | Discharge: 2019-02-08 | Disposition: A | Discharge status: Other Acute Inpatient Hospital | Payer: No Typology Code available for payment source | Source: Home / Self Care

## 2019-02-08 ENCOUNTER — Other Ambulatory Visit: Payer: Self-pay

## 2019-02-08 ENCOUNTER — Emergency Department
Admission: EM | Admit: 2019-02-08 | Discharge: 2019-02-08 | Disposition: A | Discharge status: Home / Self Care | Payer: No Typology Code available for payment source | Attending: Student in an Organized Health Care Education/Training Program | Admitting: Student in an Organized Health Care Education/Training Program

## 2019-02-08 DIAGNOSIS — Z79899 Other long term (current) drug therapy: Secondary | ICD-10-CM | POA: Diagnosis not present

## 2019-02-08 DIAGNOSIS — Y929 Unspecified place or not applicable: Secondary | ICD-10-CM | POA: Insufficient documentation

## 2019-02-08 DIAGNOSIS — S62639B Displaced fracture of distal phalanx of unspecified finger, initial encounter for open fracture: Secondary | ICD-10-CM

## 2019-02-08 DIAGNOSIS — W231XXA Caught, crushed, jammed, or pinched between stationary objects, initial encounter: Secondary | ICD-10-CM | POA: Diagnosis not present

## 2019-02-08 DIAGNOSIS — S61311A Laceration without foreign body of left index finger with damage to nail, initial encounter: Secondary | ICD-10-CM | POA: Insufficient documentation

## 2019-02-08 DIAGNOSIS — Y990 Civilian activity done for income or pay: Secondary | ICD-10-CM | POA: Insufficient documentation

## 2019-02-08 DIAGNOSIS — S62631B Displaced fracture of distal phalanx of left index finger, initial encounter for open fracture: Secondary | ICD-10-CM | POA: Insufficient documentation

## 2019-02-08 DIAGNOSIS — Y9389 Activity, other specified: Secondary | ICD-10-CM | POA: Insufficient documentation

## 2019-02-08 DIAGNOSIS — S67191A Crushing injury of left index finger, initial encounter: Secondary | ICD-10-CM | POA: Diagnosis present

## 2019-02-08 DIAGNOSIS — S6710XA Crushing injury of unspecified finger(s), initial encounter: Secondary | ICD-10-CM

## 2019-02-08 MED ORDER — SULFAMETHOXAZOLE-TRIMETHOPRIM 800-160 MG PO TABS: 1.0000 | ORAL_TABLET | Freq: Two times a day (BID) | ORAL | 0 refills | Status: DC

## 2019-02-08 MED ORDER — TETANUS-DIPHTH-ACELL PERTUSSIS 5-2.5-18.5 LF-MCG/0.5 IM SUSP
0.5000 mL | Freq: Once | INTRAMUSCULAR | Status: AC
  Administered 2019-02-08: 0.5 mL via INTRAMUSCULAR

## 2019-02-08 MED ORDER — BUPIVACAINE HCL (PF) 0.5 % IJ SOLN
30.0000 mL | Freq: Once | INTRAMUSCULAR | Status: AC
  Administered 2019-02-08: 30 mL
  Filled 2019-02-08: qty 30

## 2019-02-08 MED ORDER — HYDROCODONE-ACETAMINOPHEN 5-325 MG PO TABS: 1.0000 | ORAL_TABLET | ORAL | 0 refills | Status: AC | PRN

## 2019-02-08 MED ORDER — CEPHALEXIN 500 MG PO CAPS: 500.0000 mg | ORAL_CAPSULE | Freq: Three times a day (TID) | ORAL | 0 refills | Status: AC

## 2019-02-08 MED ORDER — LIDOCAINE HCL (PF) 1 % IJ SOLN
5.0000 mL | Freq: Once | INTRAMUSCULAR | Status: AC
  Administered 2019-02-08: 17:00:00 5 mL via INTRADERMAL
  Filled 2019-02-08: qty 5

## 2019-02-08 NOTE — ED Triage Notes (Signed)
Patient states he smashed his left index finger today while at work.

## 2019-02-08 NOTE — ED Triage Notes (Signed)
Pt reports was pulling a work table around and his left hand first digit got caught between the tables. Pt reports went to UC and was told he needed to come to the ED for laceration repair. Area wrapped and bleeding controlled.

## 2019-02-08 NOTE — Discharge Instructions (Signed)
Keep the wound clean and dry.  Call to schedule an appointment with orthopedics for follow up and re-evaluation.  Take the antibiotics and pain medication as prescribed. DO NOT take the pain medication while you are working, take tylenol 1000mg  instead.  Return to the ER for concerns if unable to see the orthopedist.

## 2019-02-08 NOTE — ED Provider Notes (Signed)
[facility] Emergency Department Provider Note  ____________________________________________  Time seen: Approximately 3:48 PM  I have reviewed the triage vital signs and the nursing notes.   HISTORY  Chief Complaint Laceration   HPI Jonathan Mayer is a 58 y.o. male who presents to the emergency department for treatment and evaluation of left index finger injury sustained while at work.  He was moving 1 order over and bringing a another table to his work area when he smashed his finger between the 2.  He was wearing his cut free gloves.  He states that when he took his hand out of the gloves, he noticed blood and states that his fingernail was stuck in the finger of the glove.  He initially presented to urgent  care due to the extent of the injury. Tdap booster was updated at urgent care.  Past Medical History:  Diagnosis Date  . Arthritis   . Family history of adverse reaction to anesthesia    MOM-BP DROPPED  . GERD (gastroesophageal reflux disease)   . Headache    MIGRAINES  . Pneumonia    X2 AGE 39 AND IN HIS 30'S    Patient Active Problem List   Diagnosis Date Noted  . Perianal venous thrombosis   . Wound dehiscence 03/27/2017  . Anemia 03/16/2017  . Elevated serum creatinine 03/16/2017  . Gastroesophageal reflux disease 03/13/2017  . Class 1 obesity due to excess calories without serious comorbidity with body mass index (BMI) of 33.0 to 33.9 in adult 03/13/2017    Past Surgical History:  Procedure Laterality Date  . COLONOSCOPY    . ESOPHAGOGASTRODUODENOSCOPY    . HEMORRHOID SURGERY N/A 05/25/2018   Procedure: HEMORRHOIDECTOMY;  Surgeon: [name], MD;  Location: [facility] ORS;  Service: General;  Laterality: N/A;  . HERNIA REPAIR Bilateral    20'S  . KNEE ARTHROSCOPY W/ MENISCAL REPAIR Right   . TONSILLECTOMY AND ADENOIDECTOMY    . TRIGGER FINGER RELEASE      Prior to Admission medications   Medication Sig Start Date End Date Taking?  Authorizing Provider  Calcium-Magnesium-Vitamin D (CITRACAL SLOW RELEASE PO) Take 2 tablets by mouth daily.    [provider]  cephALEXin (KEFLEX) 500 MG capsule Take 1 capsule (500 mg total) by mouth 3 (three) times daily for 10 days. 02/08/19 02/18/19  Brandice Busser, Johnette Abraham B, FNP  docusate sodium (COLACE) 100 MG capsule Take 100 mg by mouth 2 (two) times daily.    [provider]  esomeprazole (NEXIUM) 20 MG capsule Take 40 mg by mouth at bedtime.     [provider]  fluticasone (FLONASE) 50 MCG/ACT nasal spray Place 1 spray into both nostrils every morning.     [provider]  HYDROcodone-acetaminophen (NORCO/VICODIN) 5-325 MG tablet Take 1 tablet by mouth every 4 (four) hours as needed for moderate pain. 02/08/19 02/08/20  Astor Gentle, Johnette Abraham B, FNP  magnesium oxide (MAG-OX) 400 MG tablet Take 800 mg by mouth every morning.     [provider]  MEGARED OMEGA-3 KRILL OIL PO Take 1 capsule by mouth daily.    [provider]  Multiple Vitamins-Minerals (MULTIVITAMIN ADULT) CHEW Chew 1 each by mouth daily.     [provider]  psyllium (METAMUCIL) 58.6 % packet Take 1 packet by mouth daily.    [provider]  sulfamethoxazole-trimethoprim (BACTRIM DS) 800-160 MG tablet Take 1 tablet by mouth 2 (two) times daily. 02/08/19   Victorino Dike, FNP    Allergies  Aspirin and Ibuprofen  Family History  Problem Relation Age of Onset  . Osteoporosis Mother   . GER disease Father   . Cancer Paternal Uncle   . Hypertension Maternal Grandfather     Social History Social History   Tobacco Use  . Smoking status: Never Smoker  . Smokeless tobacco: Never Used  Substance Use Topics  . Alcohol use: No  . Drug use: No    Review of Systems  Constitutional: Negative for fever. Respiratory: Negative for cough or shortness of breath.  Musculoskeletal: Negative for myalgias Skin: Positive for laceration to the left index  finger. Neurological: Negative for numbness or paresthesias. ____________________________________________   PHYSICAL EXAM:  VITAL SIGNS: ED Triage Vitals  Enc Vitals Group     BP 02/08/19 1519 (!) 141/85     Pulse Rate 02/08/19 1519 88     Resp 02/08/19 1519 18     Temp 02/08/19 1519 98.8 F (37.1 C)     Temp Source 02/08/19 1519 Oral     SpO2 02/08/19 1519 98 %     Weight 02/08/19 1516 225 lb (102.1 kg)     Height 02/08/19 1516 5\' 7"  (1.702 m)     Head Circumference --      Peak Flow --      Pain Score 02/08/19 1515 10     Pain Loc --      Pain Edu? --      Excl. in [place]? --      Constitutional: Well appearing. Eyes: Conjunctivae are clear without discharge or drainage. Nose: No rhinorrhea noted. Mouth/Throat: Airway is patent.  Neck: No stridor. Unrestricted range of motion observed. Cardiovascular: Capillary refill is <3 seconds.  Respiratory: Respirations are even and unlabored.. Musculoskeletal: Unrestricted range of motion observed. Neurologic: Awake, alert, and oriented x 4.  Skin: Left index finger: complete nail loss with laceration to the lateral edge and finger pad.  ____________________________________________   LABS (all labs ordered are listed, but only abnormal results are displayed)  Labs Reviewed - No data to display ____________________________________________  EKG  Not indicated. ____________________________________________  RADIOLOGY  Small crescentic fracture fragment from the distal phalangeal tuft of the left index finger ____________________________________________   PROCEDURES  .Marland KitchenLaceration Repair  Date/Time: 02/08/2019 5:55 PM Performed by: Victorino Dike, FNP Authorized by: Victorino Dike, FNP   Consent:    Consent obtained:  Verbal   Consent given by:  Patient   Risks discussed:  Infection, need for additional repair, poor cosmetic result and poor wound healing Anesthesia (see MAR for exact dosages):    Anesthesia  method:  Nerve block   Block needle gauge:  27 G   Block anesthetic:  Bupivacaine 0.5% w/o epi and lidocaine 1% w/o epi   Block injection procedure:  Anatomic landmarks identified   Block outcome:  Anesthesia achieved Laceration details:    Location:  Finger   Finger location:  L index finger   Length (cm):  2 Repair type:    Repair type:  Intermediate Pre-procedure details:    Preparation:  Patient was prepped and draped in usual sterile fashion Exploration:    Hemostasis achieved with:  Direct pressure   Contaminated: no   Treatment:    Area cleansed with:  Betadine and saline   Amount of cleaning:  Extensive   Irrigation solution:  Sterile saline   Irrigation method:  Syringe Skin repair:    Repair method:  Sutures (xeroform gauze placed under edge of cuticle in place  of fingernail)   Suture size:  5-0   Suture material:  Nylon   Suture technique:  Simple interrupted   Number of sutures:  4 Approximation:    Approximation:  Close   ____________________________________________   INITIAL IMPRESSION / ASSESSMENT AND PLAN / ED COURSE  CREEDE CONIGLIO is a 58 y.o. male presents to the emergency department for treatment and evaluation after crush injury to the left index finger.  See above for details of the repair.  No obvious bony exposure on exam or on x-ray.  Placed in a static aluminum foam splint after sutures and nailbed was covered.  Patient was instructed on wound care.  He is to call and schedule follow-up appointment with orthopedics.  He will also be placed on Bactrim and Keflex given the fact that there is a tuft injury with an associated laceration.  He will also be given a prescription for Norco.  He was strongly advised to only take Tylenol if at work.  He was advised to return to the emergency department for symptoms of concern if he is unable to see the orthopedic specialist or his primary care provider.  Medications  lidocaine (PF) (XYLOCAINE) 1 % injection 5 mL  (5 mLs Intradermal Given by Other 02/08/19 1644)  bupivacaine (MARCAINE) 0.5 % injection 30 mL (30 mLs Infiltration Given by Other 02/08/19 1645)     Pertinent labs & imaging results that were available during my care of the patient were reviewed by me and considered in my medical decision making (see chart for details).  ____________________________________________   FINAL CLINICAL IMPRESSION(S) / ED DIAGNOSES  Final diagnoses:  Laceration of left index finger with damage to nail, foreign body presence unspecified, initial encounter  Crushing injury of finger, initial encounter  Open fracture of tuft of distal phalanx of finger    ED Discharge Orders         Ordered    cephALEXin (KEFLEX) 500 MG capsule  3 times daily     02/08/19 1647    sulfamethoxazole-trimethoprim (BACTRIM DS) 800-160 MG tablet  2 times daily     02/08/19 1647    HYDROcodone-acetaminophen (NORCO/VICODIN) 5-325 MG tablet  Every 4 hours PRN     02/08/19 1647           Note:  This document was prepared using Dragon voice recognition software and may include unintentional dictation errors.   Victorino Dike, FNP 02/08/19 1807    Merlyn Lot, MD 02/08/19 2022

## 2019-02-08 NOTE — ED Notes (Signed)
See triage note  Presents with injury to left index finger  States he was moving another table at work hitting his finger  Small laceration to lateral aspect of finfer and nailbed

## 2020-03-30 IMAGING — DX DG FINGER INDEX 2+V*L*
3 series · 3 of 3 positions shown · non-contrast
Comparison: None.

CLINICAL DATA: Crushing injury

EXAM:
LEFT INDEX FINGER 2+V

[finger ap]
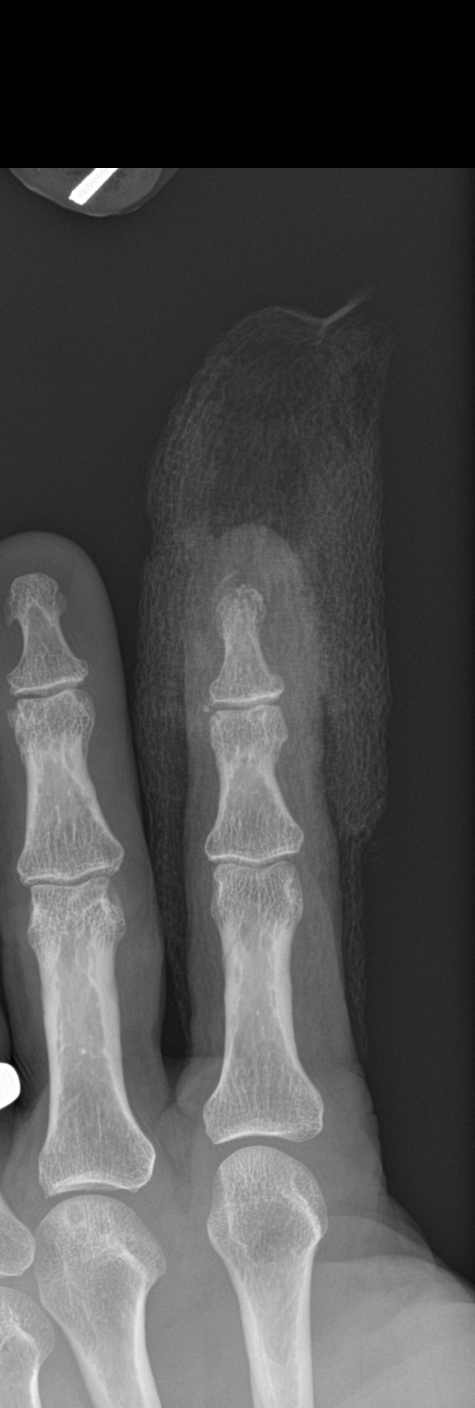

[finger obl]
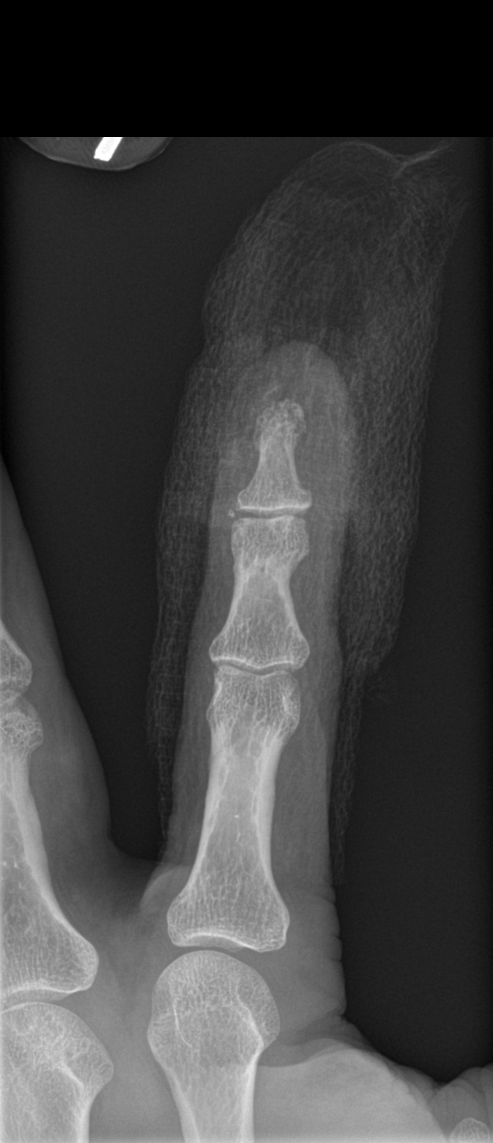

[finger lat]
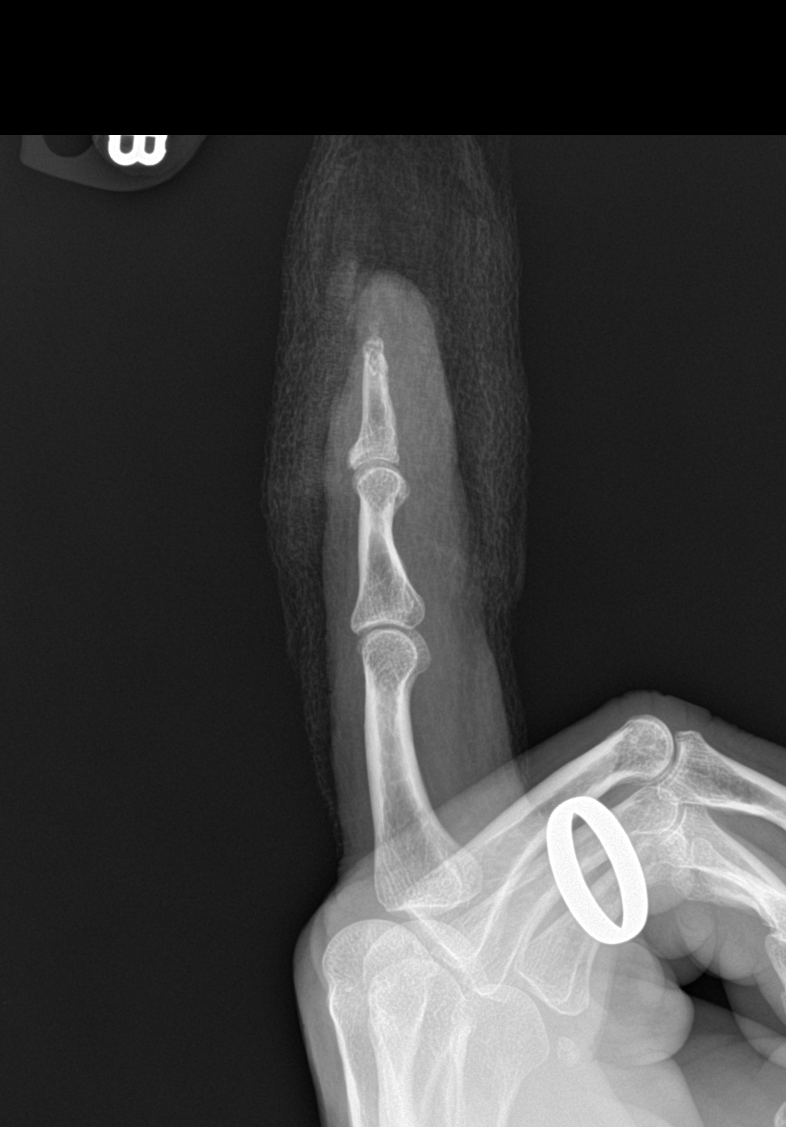

[3 of 3 positions shown; findings below may reference images not displayed]

FINDINGS: There is soft tissue irregularity and laceration involving the tip
of the second digit. A small fracture fragment likely arising from
the distal phalangeal tuft is noted. No other fracture or traumatic
malalignment is seen. Tiny corticated ossification along the ulnar
aspect of the first DIP likely reflects a small degenerative
osteophyte or ossicle. Overlying bandaging material is present.
IMPRESSION: Soft tissue irregularity and laceration involving the tip of the
second digit with a small crescentic fracture fragment likely
arising from the distal phalangeal tuft.

## 2020-10-17 ENCOUNTER — Ambulatory Visit: Payer: 59 | Admitting: Podiatry

## 2020-10-17 ENCOUNTER — Other Ambulatory Visit: Payer: Self-pay

## 2020-10-17 DIAGNOSIS — L601 Onycholysis: Secondary | ICD-10-CM | POA: Diagnosis not present

## 2020-10-17 DIAGNOSIS — B351 Tinea unguium: Secondary | ICD-10-CM

## 2020-10-17 MED ORDER — TERBINAFINE HCL 250 MG PO TABS: 250.0000 mg | ORAL_TABLET | Freq: Every day | ORAL | 0 refills | Status: AC

## 2020-10-17 NOTE — Progress Notes (Signed)
  Subjective:  Patient ID: Jonathan Mayer, male    DOB: July 09, 1960,  MRN: [id number]  Chief Complaint  Patient presents with   Nail Problem     RE EST 2015 - RIGHT GREAT NAIL THICK    60 y.o. male presents with the above complaint. History confirmed with patient.  He still with this for many months.  He wear steel toe shoes for nearly 30 years.  Objective:  Physical Exam: warm, good capillary refill, no trophic changes or ulcerative lesions, normal DP and PT pulses, and normal sensory exam. Right Foot: Mycotic appearing yellowed hallux nail with splitting and dystrophy     Assessment:   1. Onychomycosis      Plan:  Patient was evaluated and treated and all questions answered.  Suspect this is onycholysis secondary to onychomycosis infection.  I debrided the nail and took a portion of the nail to send for culture.  Prescription for Lamisil sent to pharmacy.  We discussed etiology and treatment options with this including topical oral and laser therapy.  He will follow-up in a few months after his Lamisil course to reevaluate if a second course is necessary.  Return in about 4 months (around 02/17/2021) for follow up after nail fungus treatment.

## 2020-11-02 ENCOUNTER — Encounter: Payer: Self-pay | Admitting: *Deleted

## 2020-12-03 ENCOUNTER — Telehealth: Payer: Self-pay | Admitting: *Deleted

## 2020-12-03 NOTE — Telephone Encounter (Signed)
"  Dr. Sherryle Lis is my doctor.  He prescribed me Terbinafine.  I'm half way through taking it.  I didn't read the paperwork that comes with the medication thoroughly before taking it.  It says not to be exposed to sunlight long periods of time.  I didn't know that and I was out mowing grass for a long period of time.  I'm not sure that it has caused this rash but it's all over my body.  It's real itchy.  I been using some Hydrocortisone cream, [facility] and I stopped taking the Terbinafine.  Do I need to be seen?  Is there something else that you suggest?  It's even in my head."  Dr. Sherryle Lis is out of the office this week.  I will send a message to the on-call provider.  Dr. Maxie Barb assistant, Nira Conn, will call you back with a response.  "Thank you so much, sorry for just popping up, I didn't know what else to do."

## 2020-12-03 NOTE — Telephone Encounter (Signed)
I'll calling you back.  Dr. Amalia Hailey said to stop taking the Lamisil and he said for you to take Benadryl as directed on the box.  "Is there a certain milligram or number that I'm supposed to take?"  Take the normal or regular over the counter Benadryl.  "Is it also used for sinus allergies too?"  Yes, it is used for sinus allergies too.  "My pharmacist had kind of hinted around that but said he couldn't tell me what to do, it had to come from you all.  Thanks for calling me back."

## 2020-12-31 ENCOUNTER — Encounter: Payer: Self-pay | Admitting: General Surgery

## 2021-01-16 ENCOUNTER — Ambulatory Visit: Payer: 59 | Admitting: Podiatry

## 2021-01-16 ENCOUNTER — Other Ambulatory Visit: Payer: Self-pay

## 2021-01-16 ENCOUNTER — Encounter (INDEPENDENT_AMBULATORY_CARE_PROVIDER_SITE_OTHER): Payer: Self-pay

## 2021-01-16 DIAGNOSIS — B351 Tinea unguium: Secondary | ICD-10-CM

## 2021-01-16 MED ORDER — CICLOPIROX 8 % EX SOLN: Freq: Every day | CUTANEOUS | 0 refills | Status: DC

## 2021-01-16 NOTE — Progress Notes (Signed)
  Subjective:  Patient ID: Jonathan Mayer, male    DOB: [birth year],  MRN: [id number]  Chief Complaint  Patient presents with   Nail Problem    Thick painful toenails, 3 month follow up    60 y.o. male presents with the above complaint. History confirmed with patient.  Overall he is improved with this.  He did have a reaction with hives to the terbinafine  Objective:  Physical Exam: warm, good capillary refill, no trophic changes or ulcerative lesions, normal DP and PT pulses, and normal sensory exam. Right Foot: Mycotic appearing yellowed hallux nail with splitting and dystrophy      Assessment:   1. Onychomycosis      Plan:  Patient was evaluated and treated and all questions answered.  Will discontinue Lamisil at this point I added to his allergy list for hives.  Recommend we switch to a topical treatment with Penlac which I prescribed for him after debriding the nail today.  He will follow-up me in 4 months  Return in about 4 months (around 05/19/2021) for follow up after nail fungus treatment.

## 2021-02-18 ENCOUNTER — Telehealth: Payer: Self-pay | Admitting: *Deleted

## 2021-02-18 NOTE — Telephone Encounter (Signed)
"  I'm a patient of Dr. Sherryle Lis.  He's been treating me for a nail fungus on my right large toe.  Since October he's had me to paint Ciclopirox to get rid of the fungus.   The antibiotic he had me to use was causing a major condition of hives.  Thanksgiving afternoon, I noticed I couldn't wear my sock around my foot very well.  The skin around the nail had turned a little pink.  My toe is now a little bit puffy around the nail and underneath the bottom of the toe.  I was wondering if I could be seen as soon as possible."  I'm returning your call.  We were closed on Friday, I apologize.  "I guess I wasn't paying attention that it was a Holiday."  We can get you in to see Dr. Sherryle Lis on today.  "Well, I stopped putting the medicine on my toe and it has cleared up.  I had been putting it on since I last saw him.  I make sure I apply it daily, I blow dry it after I apply it and after I get out of the shower.  So, should I stop using it, what do you recommend?"  I recommend you start using it again.  It could have gotten aggravated by your shoes.  "It's the same shoes I always wear."  Well see how it goes, if it gets irritated again, stop using it.  I also recommend you not blow dry the nail, that could also be irritating it.  "Okay, I'll start using it again and I'll stop blow drying it.  We'll see how it goes.  Should I call you back if it irritates it again?"  Yes, give [place] a call if it gets irritated again.

## 2021-03-08 ENCOUNTER — Ambulatory Visit: Payer: Self-pay

## 2021-03-08 NOTE — Telephone Encounter (Signed)
Patient called, left VM to return the call to the office to discuss symptoms with a nurse.  

## 2021-03-08 NOTE — Telephone Encounter (Signed)
2nd attempt, pt called, left VM to call back.

## 2021-03-09 NOTE — Telephone Encounter (Signed)
Third attempt to call pt - sending back to office to review. Gave pt call back number in case pt would like to call back to discuss.

## 2021-03-11 ENCOUNTER — Ambulatory Visit: Payer: Self-pay

## 2021-03-11 ENCOUNTER — Ambulatory Visit: Payer: 59 | Admitting: Family Medicine

## 2021-03-11 NOTE — Telephone Encounter (Signed)
Lmom asking pt to call back to discuss why he needs a new pt appt.

## 2021-03-11 NOTE — Telephone Encounter (Signed)
°  Chief Complaint: left knee pain Symptoms: swelling Frequency: constant Pertinent Negatives: Patient denies redness, calf pain, diff. Breathing, fever Disposition: [] ED /[x] Urgent Care (no appt availability in office) / [] Appointment(In office/virtual)/ []  [facility]/ [] Home Care/ [] Refused Recommended Disposition  Additional Notes: pt advised to go to UC- needs new pt appt. Pt had been scheduled for appt today but pt had not been seen since 2018. Pt frustrated that he cannot be seen. Wrong number was written in appt per pt and they did not call the cell phone as requested by pt's wife.. Reported to office of pt experience. Apologized to pt many times. He wants to come back to [place] but did not understand why he would be considered an new pt.      Reason for Disposition  [1] MODERATE pain (e.g., interferes with normal activities, limping) AND [2] present > 3 days  Answer Assessment - Initial Assessment Questions 1. LOCATION and RADIATION: "Where is the pain located?"      Left knee 2. QUALITY: "What does the pain feel like?"  (e.g., sharp, dull, aching, burning)     Dull ache 3. SEVERITY: "How bad is the pain?" "What does it keep you from doing?"   (Scale 1-10; or mild, moderate, severe)   -  MILD (1-3): doesn't interfere with normal activities    -  MODERATE (4-7): interferes with normal activities (e.g., work or school) or awakens from sleep, limping    -  SEVERE (8-10): excruciating pain, unable to do any normal activities, unable to walk     moderate 4. ONSET: "When did the pain start?" "Does it come and go, or is it there all the time?"     Last Wednesday 5. RECURRENT: "Have you had this pain before?" If Yes, ask: "When, and what happened then?"     Yes but other knee (torn mensicus) 6. SETTING: "Has there been any recent work, exercise or other activity that involved that part of the body?"      Mowing several yards 7. AGGRAVATING FACTORS: "What makes the knee  pain worse?" (e.g., walking, climbing stairs, running)     Walking  8. ASSOCIATED SYMPTOMS: "Is there any swelling or redness of the knee?"     Swelling to the top and sides 9. OTHER SYMPTOMS: "Do you have any other symptoms?" (e.g., chest pain, difficulty breathing, fever, calf pain)     no 10. PREGNANCY: "Is there any chance you are pregnant?" "When was your last menstrual period?"       *No Answer*  Protocols used: Knee Pain-A-AH

## 2021-05-22 ENCOUNTER — Other Ambulatory Visit: Payer: Self-pay

## 2021-05-22 ENCOUNTER — Ambulatory Visit: Payer: 59 | Admitting: Podiatry

## 2021-05-22 DIAGNOSIS — B351 Tinea unguium: Secondary | ICD-10-CM

## 2021-05-22 MED ORDER — FLUCONAZOLE 150 MG PO TABS: 150.0000 mg | ORAL_TABLET | ORAL | 0 refills | Status: DC

## 2021-05-27 NOTE — Progress Notes (Signed)
?  Subjective:  ?Patient ID: Jonathan Mayer, male    DOB: 1960/12/20,  MRN: [id number] ? ?Chief Complaint  ?Patient presents with  ? Nail Problem  ? ? ?61 y.o. male returns for follow-up with the above complaint. History confirmed with patient.  Feels like it is worse now has been using the Penlac ? ?Objective:  ?Physical Exam: ?warm, good capillary refill, no trophic changes or ulcerative lesions, normal DP and PT pulses, and normal sensory exam. ?Right Foot: Mycotic appearing yellowed hallux nail with splitting and dystrophy, worse since last visit ? ? ? ? ? ? ?Assessment:  ? ?1. Onychomycosis   ? ? ? ? ?Plan:  ?Patient was evaluated and treated and all questions answered. ? ?Has not responded well to Penlac.  I discussed further treatment with oral medications such as fluconazole pulsed dosing.  Rx sent to pharmacy for 26-week course.  We also discussed the option of laser treatment and the effectiveness of this but the downside being that it is not covered by insurance and would carry out-of-pocket cost.  I will see him back in 6 months for reevaluation photographs were taken again today ? ?Return in about 6 months (around 11/22/2021) for follow up after nail fungus treatment.  ? ? ?

## 2021-10-08 ENCOUNTER — Encounter
Admission: RE | Admit: 2021-10-08 | Discharge: 2021-10-08 | Disposition: A | Discharge status: Home / Self Care | Payer: 59 | Source: Ambulatory Visit | Attending: Specialist | Admitting: Specialist

## 2021-10-08 NOTE — Patient Instructions (Addendum)
Your procedure is scheduled on: Thursday October 10, 2021. Report to Day Surgery inside [facility] 2nd floor, stop by admissions desk before getting on elevator. To find out your arrival time please call [phone] between 1PM - 3PM on Wednesday October 09, 2021.  Remember: Instructions that are not followed completely may result in serious medical risk,  up to and including death, or upon the discretion of your surgeon and anesthesiologist your  surgery may need to be rescheduled.     _X__ 1. Do not eat food or drink fluids after midnight the night before your procedure.                 No chewing gum or hard candies.   __X__2.  On the morning of surgery brush your teeth with toothpaste and water, you                may rinse your mouth with mouthwash if you wish.  Do not swallow any toothpaste or mouthwash.     _X__ 3.  No Alcohol for 24 hours before or after surgery.   _X__ 4.  Do Not Smoke or use e-cigarettes For 24 Hours Prior to Your Surgery.                 Do not use any chewable tobacco products for at least 6 hours prior to                 Surgery.  _X__  5.  Do not use any recreational drugs (marijuana, cocaine, heroin, ecstasy, MDMA or other)                For at least one week prior to your surgery.  Combination of these drugs with anesthesia                May have life threatening results.  ____  6.  Bring all medications with you on the day of surgery if instructed.   __X__  7.  Notify your doctor if there is any change in your medical condition      (cold, fever, infections).     Do not wear jewelry, make-up, hairpins, clips or nail polish. Do not wear lotions, powders, or perfumes. You may wear deodorant. Do not shave 48 hours prior to surgery. Men may shave face and neck. Do not bring valuables to the hospital.    [facility] is not responsible for any belongings or valuables.  Contacts, dentures or bridgework may not be worn into  surgery. Leave your suitcase in the car. After surgery it may be brought to your room. For patients admitted to the hospital, discharge time is determined by your treatment team.   Patients discharged the day of surgery will not be allowed to drive home.   Make arrangements for someone to be with you for the first 24 hours of your Same Day Discharge.   __X__ Take these medicines the morning of surgery with A SIP OF WATER:    1. esomeprazole (NEXIUM) 40 MG capsule  2.   3.   4.  5.  6.  ____ Fleet Enema (as directed)   __X__ Use CHG Soap (or Antibacterial soap) as directed  ____ Use Benzoyl Peroxide Gel as instructed  ____ Use inhalers on the day of surgery  ____ Stop metformin 2 days prior to surgery    ____ Take 1/2 of usual insulin dose the night before surgery. No insulin the morning  of surgery.   ____ Call your PCP, cardiologist, or Pulmonologist if taking Coumadin/Plavix/aspirin and ask when to stop before your surgery.   __X__ One Week prior to surgery- Stop Anti-inflammatories such as Ibuprofen, Aleve, Advil, Motrin, meloxicam (MOBIC), diclofenac, etodolac, ketorolac, Toradol, Daypro, piroxicam, Goody's or BC powders. OK TO USE TYLENOL IF NEEDED   __X__ Stop supplements until after surgery.    ____ Bring C-Pap to the hospital.    If you have any questions regarding your pre-procedure instructions,  Please call Pre-admit Testing at [phone]

## 2021-10-09 ENCOUNTER — Other Ambulatory Visit: Payer: Self-pay | Admitting: Specialist

## 2021-10-09 NOTE — H&P (Signed)
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 Symptoms are rated as moderate to severe, and have been worsening.  This is significantly impairing activities of daily living.  He has elected for surgical management.   Past Medical History:  Diagnosis Date   Arthritis    Family history of adverse reaction to anesthesia    MOM-BP DROPPED   GERD (gastroesophageal reflux disease)    Headache    MIGRAINES   Pneumonia    X2 AGE 23 AND IN HIS 30'S   Past Surgical History:  Procedure Laterality Date   COLONOSCOPY  2015   ESOPHAGOGASTRODUODENOSCOPY     HEMORRHOID SURGERY N/A 05/25/2018   Procedure: HEMORRHOIDECTOMY;  Surgeon: [name], MD;  Location: [facility] ORS;  Service: General;  Laterality: N/A;   HERNIA REPAIR Bilateral    20'S   KNEE ARTHROSCOPY W/ MENISCAL REPAIR Right    TONSILLECTOMY AND ADENOIDECTOMY     TRIGGER FINGER RELEASE     x3   Social History   Socioeconomic History   Marital status: Married    Spouse name: Not on file   Number of children: Not on file   Years of education: Not on file   Highest education level: Not on file  Occupational History   Not on file  Tobacco Use   Smoking status: Never   Smokeless tobacco: Never  Vaping Use   Vaping Use: Never used  Substance and Sexual Activity   Alcohol use: No   Drug use: No   Sexual activity: Yes  Other Topics Concern   Not on file  Social History Narrative   Not on file   Social Determinants of Health   Financial Resource Strain: Not on file  Food Insecurity: Not on file  Transportation Needs: Not on file  Physical Activity: Not on file  Stress: Not on file  Social Connections: Not on file   Family History  Problem Relation Age of Onset   Osteoporosis Mother     GER disease Father    Cancer Paternal Uncle    Hypertension Maternal Grandfather    Allergies  Allergen Reactions   Aspirin Itching and Other (See Comments)    Eyes swell   Ibuprofen Itching and Other (See Comments)    Eyes swell   Terbinafine And Related Hives    Happened with sun exposure, resolved with Benadryl   Prior to Admission medications   Medication Sig Start Date End Date Taking? Authorizing Provider  Calcium-Magnesium-Vitamin D (CITRACAL SLOW RELEASE PO) Take 2 tablets by mouth daily. Patient not taking: Reported on 10/08/2021    [provider]  docusate sodium (COLACE) 100 MG capsule Take 100 mg by mouth 2 (two) times daily. Patient not taking: Reported on 10/08/2021    [provider]  esomeprazole (NEXIUM) 20 MG capsule Take 40 mg by mouth at bedtime.     [provider]  fluconazole (DIFLUCAN) 150 MG tablet Take 1 tablet (150 mg total) by mouth once a week. 05/22/21   McDonald, Stephan Minister, DPM  fluticasone (FLONASE) 50 MCG/ACT nasal spray Place 1 spray into both nostrils every morning.     [provider]  glucosamine-chondroitin 500-400 MG tablet Take 1 tablet by mouth 3 (three) times daily.    [provider]  magnesium oxide (MAG-OX) 400 MG tablet Take 800 mg by mouth every morning.  Patient not taking: Reported on 10/08/2021    [provider]  MEGARED OMEGA-3 KRILL OIL PO Take 1 capsule by mouth daily. Patient not taking: Reported on 10/08/2021    [provider]  Multiple Vitamins-Minerals (MULTIVITAMIN ADULT) CHEW Chew 1 each by mouth daily.  Patient not taking: Reported on 10/08/2021    [provider]  psyllium (METAMUCIL) 58.6 % packet Take 1 packet by mouth daily. Patient not taking: Reported on 10/08/2021    [provider]  sulfamethoxazole-trimethoprim (BACTRIM DS) 800-160 MG tablet Take 1 tablet by mouth 2 (two) times daily. Patient not taking: Reported on 10/08/2021 02/08/19    Sherrie George B, FNP     Positive ROS: All other systems have been reviewed and were otherwise negative with the exception of those mentioned in the HPI and as above.  Physical Exam: General: Alert, no acute distress Cardiovascular: No pedal edema. Heart is regular and without murmur.  Respiratory: No cyanosis, no use of accessory musculature. Lungs are clear. GI: No organomegaly, abdomen is soft and non-tender Skin: No lesions in the area of chief complaint Neurologic: Sensation intact distally Psychiatric: Patient is competent for consent with normal mood and affect Lymphatic: No axillary or cervical lymphadenopathy   MUSCULOSKELETAL: There is deformity of the left thumb CMC joint.  The metacarpal is prominent.  There is pain with a positive grind test.  His pinch is weak.  The skin is intact.  median nerve compression is negative.  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   Park Breed, MD [phone]   10/09/2021 11:13 AM

## 2021-10-10 ENCOUNTER — Other Ambulatory Visit: Payer: Self-pay

## 2021-10-10 ENCOUNTER — Ambulatory Visit
Admission: RE | Admit: 2021-10-10 | Discharge: 2021-10-10 | Disposition: A | Discharge status: Home / Self Care | Payer: 59 | Attending: Specialist | Admitting: Specialist

## 2021-10-10 ENCOUNTER — Ambulatory Visit: Payer: 59 | Admitting: Anesthesiology

## 2021-10-10 ENCOUNTER — Encounter
Admission: RE | Disposition: A | Discharge status: Home / Self Care | Payer: Self-pay | Source: Home / Self Care | Attending: Specialist

## 2021-10-10 ENCOUNTER — Encounter: Payer: Self-pay | Admitting: Specialist

## 2021-10-10 ENCOUNTER — Ambulatory Visit: Payer: 59

## 2021-10-10 DIAGNOSIS — M1812 Unilateral primary osteoarthritis of first carpometacarpal joint, left hand: Secondary | ICD-10-CM | POA: Insufficient documentation

## 2021-10-10 DIAGNOSIS — K219 Gastro-esophageal reflux disease without esophagitis: Secondary | ICD-10-CM | POA: Insufficient documentation

## 2021-10-10 HISTORY — PX: CARPOMETACARPAL (CMC) FUSION OF THUMB: SHX6290

## 2021-10-10 SURGERY — CARPOMETACARPAL (CMC) FUSION OF THUMB
Anesthesia: General | Site: Thumb | Laterality: Left

## 2021-10-10 MED ORDER — MIDAZOLAM HCL 2 MG/2ML IJ SOLN
INTRAMUSCULAR | Status: DC | PRN
  Administered 2021-10-10: 2 mg via INTRAVENOUS

## 2021-10-10 MED ORDER — DEXMEDETOMIDINE (PRECEDEX) IN NS 20 MCG/5ML (4 MCG/ML) IV SYRINGE
PREFILLED_SYRINGE | INTRAVENOUS | Status: DC | PRN
  Administered 2021-10-10: 4 ug via INTRAVENOUS
  Administered 2021-10-10: 8 ug via INTRAVENOUS
  Administered 2021-10-10: 4 ug via INTRAVENOUS

## 2021-10-10 MED ORDER — BUPIVACAINE HCL (PF) 0.5 % IJ SOLN
INTRAMUSCULAR | Status: AC
  Filled 2021-10-10: qty 30

## 2021-10-10 MED ORDER — ONDANSETRON HCL 4 MG/2ML IJ SOLN
INTRAMUSCULAR | Status: DC | PRN
  Administered 2021-10-10: 4 mg via INTRAVENOUS

## 2021-10-10 MED ORDER — GABAPENTIN 300 MG PO CAPS: 300.0000 mg | ORAL_CAPSULE | ORAL | Status: AC

## 2021-10-10 MED ORDER — ACETAMINOPHEN 10 MG/ML IV SOLN
INTRAVENOUS | Status: DC | PRN
  Administered 2021-10-10: 1000 mg via INTRAVENOUS

## 2021-10-10 MED ORDER — LACTATED RINGERS IV SOLN: INTRAVENOUS | Status: DC

## 2021-10-10 MED ORDER — BUPIVACAINE HCL (PF) 0.5 % IJ SOLN
INTRAMUSCULAR | Status: DC | PRN
  Administered 2021-10-10: 30 mL

## 2021-10-10 MED ORDER — CEFAZOLIN SODIUM-DEXTROSE 2-4 GM/100ML-% IV SOLN: 2.0000 g | INTRAVENOUS | Status: DC

## 2021-10-10 MED ORDER — FENTANYL CITRATE (PF) 100 MCG/2ML IJ SOLN
INTRAMUSCULAR | Status: AC
  Filled 2021-10-10: qty 2

## 2021-10-10 MED ORDER — CEFAZOLIN SODIUM-DEXTROSE 2-4 GM/100ML-% IV SOLN
INTRAVENOUS | Status: AC
  Filled 2021-10-10: qty 100

## 2021-10-10 MED ORDER — PROPOFOL 10 MG/ML IV BOLUS
INTRAVENOUS | Status: DC | PRN
  Administered 2021-10-10: 120 mg via INTRAVENOUS

## 2021-10-10 MED ORDER — OXYCODONE HCL 5 MG PO TABS
5.0000 mg | ORAL_TABLET | Freq: Once | ORAL | Status: AC | PRN
  Administered 2021-10-10: 5 mg via ORAL

## 2021-10-10 MED ORDER — PHENYLEPHRINE HCL (PRESSORS) 10 MG/ML IV SOLN
INTRAVENOUS | Status: DC | PRN
  Administered 2021-10-10 (×2): 40 ug via INTRAVENOUS

## 2021-10-10 MED ORDER — PROMETHAZINE HCL 25 MG/ML IJ SOLN: 6.2500 mg | INTRAMUSCULAR | Status: DC | PRN

## 2021-10-10 MED ORDER — ORAL CARE MOUTH RINSE: 15.0000 mL | Freq: Once | OROMUCOSAL | Status: AC

## 2021-10-10 MED ORDER — GABAPENTIN 300 MG PO CAPS
ORAL_CAPSULE | ORAL | Status: AC
  Administered 2021-10-10: 300 mg via ORAL
  Filled 2021-10-10: qty 1

## 2021-10-10 MED ORDER — ACETAMINOPHEN 10 MG/ML IV SOLN
INTRAVENOUS | Status: AC
Start: 2021-10-10 — End: ?
  Filled 2021-10-10: qty 100

## 2021-10-10 MED ORDER — CHLORHEXIDINE GLUCONATE 0.12 % MT SOLN: 15.0000 mL | Freq: Once | OROMUCOSAL | Status: AC

## 2021-10-10 MED ORDER — NEOMYCIN-POLYMYXIN B GU 40-200000 IR SOLN
Status: AC
  Filled 2021-10-10: qty 20

## 2021-10-10 MED ORDER — ACETAMINOPHEN 10 MG/ML IV SOLN: 1000.0000 mg | Freq: Once | INTRAVENOUS | Status: DC | PRN

## 2021-10-10 MED ORDER — MIDAZOLAM HCL 2 MG/2ML IJ SOLN
INTRAMUSCULAR | Status: AC
  Filled 2021-10-10: qty 2

## 2021-10-10 MED ORDER — FENTANYL CITRATE (PF) 100 MCG/2ML IJ SOLN
INTRAMUSCULAR | Status: DC | PRN
  Administered 2021-10-10 (×2): 25 ug via INTRAVENOUS
  Administered 2021-10-10: 50 ug via INTRAVENOUS
  Administered 2021-10-10: 25 ug via INTRAVENOUS

## 2021-10-10 MED ORDER — GABAPENTIN 400 MG PO CAPS: 400.0000 mg | ORAL_CAPSULE | Freq: Three times a day (TID) | ORAL | 3 refills | Status: DC

## 2021-10-10 MED ORDER — PROPOFOL 10 MG/ML IV BOLUS
INTRAVENOUS | Status: AC
  Filled 2021-10-10: qty 20

## 2021-10-10 MED ORDER — CHLORHEXIDINE GLUCONATE CLOTH 2 % EX PADS
6.0000 | MEDICATED_PAD | Freq: Once | CUTANEOUS | Status: AC
  Administered 2021-10-10: 6 via TOPICAL

## 2021-10-10 MED ORDER — OXYCODONE HCL 5 MG/5ML PO SOLN: 5.0000 mg | Freq: Once | ORAL | Status: AC | PRN

## 2021-10-10 MED ORDER — LIDOCAINE HCL (CARDIAC) PF 100 MG/5ML IV SOSY
PREFILLED_SYRINGE | INTRAVENOUS | Status: DC | PRN
  Administered 2021-10-10: 50 mg via INTRAVENOUS

## 2021-10-10 MED ORDER — HYDROCODONE-ACETAMINOPHEN 5-325 MG PO TABS: 1.0000 | ORAL_TABLET | Freq: Four times a day (QID) | ORAL | 0 refills | Status: DC | PRN

## 2021-10-10 MED ORDER — SODIUM CHLORIDE 0.9 % IR SOLN
Status: DC | PRN
  Administered 2021-10-10: 1002 mL

## 2021-10-10 MED ORDER — FENTANYL CITRATE (PF) 100 MCG/2ML IJ SOLN: 25.0000 ug | INTRAMUSCULAR | Status: DC | PRN

## 2021-10-10 MED ORDER — EPHEDRINE SULFATE (PRESSORS) 50 MG/ML IJ SOLN
INTRAMUSCULAR | Status: DC | PRN
  Administered 2021-10-10 (×2): 5 mg via INTRAVENOUS

## 2021-10-10 MED ORDER — DEXAMETHASONE SODIUM PHOSPHATE 10 MG/ML IJ SOLN
INTRAMUSCULAR | Status: DC | PRN
  Administered 2021-10-10: 10 mg via INTRAVENOUS

## 2021-10-10 MED ORDER — CHLORHEXIDINE GLUCONATE 0.12 % MT SOLN
OROMUCOSAL | Status: AC
  Administered 2021-10-10: 15 mL via OROMUCOSAL
  Filled 2021-10-10: qty 15

## 2021-10-10 MED ORDER — DROPERIDOL 2.5 MG/ML IJ SOLN: 0.6250 mg | Freq: Once | INTRAMUSCULAR | Status: DC | PRN

## 2021-10-10 MED ORDER — OXYCODONE HCL 5 MG PO TABS
ORAL_TABLET | ORAL | Status: AC
  Filled 2021-10-10: qty 1

## 2021-10-10 SURGICAL SUPPLY — 47 items
BLADE OSC/SAGITTAL MD 5.5X18 (BLADE) ×2 IMPLANT
BLADE SURG MINI STRL (BLADE) ×2 IMPLANT
BNDG ESMARK 4X12 TAN STRL LF (GAUZE/BANDAGES/DRESSINGS) ×2 IMPLANT
CHLORAPREP W/TINT 26 (MISCELLANEOUS) ×2 IMPLANT
CUFF TOURN SGL QUICK 18X4 (TOURNIQUET CUFF) ×2 IMPLANT
DRAPE FLUOR MINI C-ARM 54X84 (DRAPES) ×2 IMPLANT
DRSG GAUZE FLUFF 36X18 (GAUZE/BANDAGES/DRESSINGS) ×4 IMPLANT
ELECT REM PT RETURN 9FT ADLT (ELECTROSURGICAL) ×2
ELECTRODE REM PT RTRN 9FT ADLT (ELECTROSURGICAL) ×1 IMPLANT
GAUZE XEROFORM 1X8 LF (GAUZE/BANDAGES/DRESSINGS) ×2 IMPLANT
GLOVE BIO SURGEON STRL SZ7.5 (GLOVE) ×2 IMPLANT
GLOVE BIO SURGEON STRL SZ8 (GLOVE) IMPLANT
GOWN STRL REUS W/ TWL LRG LVL3 (GOWN DISPOSABLE) ×1 IMPLANT
GOWN STRL REUS W/TWL LRG LVL3 (GOWN DISPOSABLE) ×1
GOWN STRL REUS W/TWL LRG LVL4 (GOWN DISPOSABLE) ×2 IMPLANT
KIT TURNOVER KIT A (KITS) ×2 IMPLANT
LOOP VESSEL MINI 0.8X406 BLUE (MISCELLANEOUS) ×1 IMPLANT
LOOPS BLUE MINI 0.8X406MM (MISCELLANEOUS) ×1
MANIFOLD NEPTUNE II (INSTRUMENTS) ×2 IMPLANT
NDL FILTER BLUNT 18X1 1/2 (NEEDLE) ×1 IMPLANT
NEEDLE FILTER BLUNT 18X 1/2SAF (NEEDLE) ×1
NEEDLE FILTER BLUNT 18X1 1/2 (NEEDLE) ×1 IMPLANT
NS IRRIG 500ML POUR BTL (IV SOLUTION) ×2 IMPLANT
PACK EXTREMITY ARMC (MISCELLANEOUS) ×2 IMPLANT
PAD CAST CTTN 4X4 STRL (SOFTGOODS) ×1 IMPLANT
PADDING CAST COTTON 4X4 STRL (SOFTGOODS) ×1
PASSER SUT SWANSON 36MM LOOP (INSTRUMENTS) ×2 IMPLANT
SPIKE FLUID TRANSFER (MISCELLANEOUS) ×2 IMPLANT
SPLINT CAST 1 STEP 3X12 (MISCELLANEOUS) ×2 IMPLANT
SPONGE T-LAP 18X18 ~~LOC~~+RFID (SPONGE) ×2 IMPLANT
STOCKINETTE 48X4 2 PLY STRL (GAUZE/BANDAGES/DRESSINGS) ×1 IMPLANT
STOCKINETTE BIAS CUT 4 980044 (GAUZE/BANDAGES/DRESSINGS) ×2 IMPLANT
STOCKINETTE STRL 4IN 9604848 (GAUZE/BANDAGES/DRESSINGS) ×2 IMPLANT
SUT ETHILON 4-0 (SUTURE) ×1
SUT ETHILON 4-0 FS2 18XMFL BLK (SUTURE) ×1
SUT ETHILON 5-0 FS-2 18 BLK (SUTURE) ×2 IMPLANT
SUT VIC AB 2-0 SH 27 (SUTURE) ×2
SUT VIC AB 2-0 SH 27XBRD (SUTURE) ×1 IMPLANT
SUT VIC AB 3-0 SH 27 (SUTURE) ×1
SUT VIC AB 3-0 SH 27X BRD (SUTURE) ×1 IMPLANT
SUT VIC AB 4-0 SH 27 (SUTURE) ×1
SUT VIC AB 4-0 SH 27XANBCTRL (SUTURE) ×1 IMPLANT
SUTURE ETHLN 4-0 FS2 18XMF BLK (SUTURE) ×1 IMPLANT
SYR 30ML LL (SYRINGE) ×2 IMPLANT
SYR 5ML LL (SYRINGE) ×2 IMPLANT
WATER STERILE IRR 500ML POUR (IV SOLUTION) ×2 IMPLANT
WIRE Z .062 C-WIRE SPADE TIP (WIRE) ×2 IMPLANT

## 2021-10-10 NOTE — Anesthesia Postprocedure Evaluation (Signed)
Anesthesia Post Note  Patient: Jonathan Mayer  Procedure(s) Performed: CARPOMETACARPAL ([place]) FUSION OF THUMB (Left: Thumb)  Patient location during evaluation: PACU Anesthesia Type: General Level of consciousness: awake and alert Pain management: pain level controlled Vital Signs Assessment: post-procedure vital signs reviewed and stable Respiratory status: spontaneous breathing, nonlabored ventilation and respiratory function stable Cardiovascular status: blood pressure returned to baseline and stable Postop Assessment: no apparent nausea or vomiting Anesthetic complications: no   No notable events documented.   Last Vitals:  Vitals:   10/10/21 1053 10/10/21 1117  BP: (!) 144/99 (!) 141/98  Pulse: 62 64  Resp: 18   Temp: (!) 36.2 C   SpO2: 95% 100%    Last Pain:  Vitals:   10/10/21 1053  TempSrc: Temporal  PainSc: 0-No pain                 [place] Ouch

## 2021-10-10 NOTE — Discharge Instructions (Signed)
AMBULATORY SURGERY  ?DISCHARGE INSTRUCTIONS ? ? ?The drugs that you were given will stay in your system until tomorrow so for the next 24 hours you should not: ? ?Drive an automobile ?Make any legal decisions ?Drink any alcoholic beverage ? ? ?You may resume regular meals tomorrow.  Today it is better to start with liquids and gradually work up to solid foods. ? ?You may eat anything you prefer, but it is better to start with liquids, then soup and crackers, and gradually work up to solid foods. ? ? ?Please notify your doctor immediately if you have any unusual bleeding, trouble breathing, redness and pain at the surgery site, drainage, fever, or pain not relieved by medication. ? ? ? ?Additional Instructions: ? ? ? ?Please contact your physician with any problems or Same Day Surgery at [phone], Monday through Friday 6 am to 4 pm, or [facility] number at [phone].  ?

## 2021-10-10 NOTE — H&P (Signed)
THE PATIENT WAS SEEN PRIOR TO SURGERY TODAY.  HISTORY, ALLERGIES, HOME MEDICATIONS AND OPERATIVE PROCEDURE WERE REVIEWED. RISKS AND BENEFITS OF SURGERY DISCUSSED WITH PATIENT AGAIN.  NO CHANGES FROM INITIAL HISTORY AND PHYSICAL NOTED.    

## 2021-10-10 NOTE — Op Note (Signed)
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    The risks benefits and alternatives were discussed with the patient preoperatively including but not limited to the risks of infection, bleeding, nerve injury, cardiopulmonary complications, the need for revision surgery, among others, and the patient was willing to proceed.  EBL: None   TOURNIQUET TIME: 105 MIN  OPERATIVE IMPLANTS: None  OPERATIVE FINDINGS: Advanced arthritis left  thumb cmc joint  OPERATIVE PROCEDURE:  The patient was brought to the operating room and underwent satisfactory general anesthesia in the supine position.  The operative arm was prepped and draped in a sterile fashion.  The patient was noted to have an excellent palmaris longus tendon.  3 short transverse incisions were made over the course of the tendon and it was harvested under direct vision.  It was then placed in a moist sponge on the back table. Wounds were irrigated and closed with 4-0 vicryl and 5-0 nylon.  An S-shaped incision was then made over the base of the thumb metacarpal dorsally.  Dissection was carried out carefully under loupe magnification, carefully sparing the sensory nerves.  The tendon sheath around the abductor pollicis longus and extensor pollicis brevis was released under direct vision, including over the radial styloid.  Nerves were carefully spared.  Retractors were inserted and the capsule of the trapezium and metacarpal were opened longitudinally.   The radial artery and veins were carefully dissected free and retracted with a vessel loop drain.  The capsule was dissected off the trapezium and the trapezium was then morselized with the oscillating saw and rongeur.  The flexor carpi radialis tendon was freed up in the base of the wound.  The palmaris longus tendon was passed beneath this.  A 3.5 mm drill was used to create a tunnel through the base of the metacarpal.  The tendon was then passed up through the metacarpal using a tendon passer.  The thumb had been placed in traction which was then released.  The tendon was tied upon itself and the knot was sutured to itself prevent slippage.  It was then tied in multiple knots and sutured into a ball.  This was then rotated into the space between the metacarpal and the scaphoid.  The capsule was then carefully and thoroughly, closed with 2-0 Vicryl suture.  After irrigation, the skin was closed with 5-0 nylon.  1/2% marcaine was injected in all wounds.  A well-padded thumb spica splint was applied.Tourniquet was deflated with good return of blood flow to the hand.  Sponge and needle counts were correct.  Patient was taken to recovery in good condition.  Park Breed, MD

## 2021-10-10 NOTE — Transfer of Care (Signed)
Immediate Anesthesia Transfer of Care Note  Patient: Jonathan Mayer  Procedure(s) Performed: CARPOMETACARPAL ([place]) FUSION OF THUMB (Left: Thumb)  Patient Location: PACU  Anesthesia Type:General  Level of Consciousness: drowsy  Airway & Oxygen Therapy: Patient Spontanous Breathing and Patient connected to face mask oxygen  Post-op Assessment: Report given to RN and Post -op Vital signs reviewed and stable  Post vital signs: Reviewed and stable  Last Vitals:  Vitals Value Taken Time  BP 106/64 10/10/21 1015  Temp    Pulse 55 10/10/21 1018  Resp 14 10/10/21 1018  SpO2 95 % 10/10/21 1018  Vitals shown include unvalidated device data.  Last Pain:  Vitals:   10/10/21 0703  TempSrc: Temporal  PainSc: 0-No pain         Complications: No notable events documented.

## 2021-10-10 NOTE — Anesthesia Preprocedure Evaluation (Signed)
Anesthesia Evaluation  Patient identified by MRN, date of birth, ID band Patient awake    Reviewed: Allergy & Precautions, NPO status , Patient's Chart, lab work & pertinent test results  History of Anesthesia Complications Negative for: history of anesthetic complications  Airway Mallampati: III  TM Distance: <3 FB Neck ROM: full    Dental  (+) Chipped   Pulmonary neg pulmonary ROS, neg shortness of breath,    Pulmonary exam normal        Cardiovascular Exercise Tolerance: Good (-) angina(-) Past MI and (-) DOE negative cardio ROS Normal cardiovascular exam     Neuro/Psych  Headaches, negative psych ROS   GI/Hepatic Neg liver ROS, GERD  Controlled,  Endo/Other  negative endocrine ROS  Renal/GU      Musculoskeletal   Abdominal   Peds  Hematology negative hematology ROS (+)   Anesthesia Other Findings Past Medical History: No date: Arthritis No date: Family history of adverse reaction to anesthesia     Comment:  MOM-BP DROPPED No date: GERD (gastroesophageal reflux disease) No date: Headache     Comment:  MIGRAINES No date: Pneumonia     Comment:  X2 AGE 11 AND IN HIS 30'S  Past Surgical History: 2015: COLONOSCOPY No date: ESOPHAGOGASTRODUODENOSCOPY 05/25/2018: HEMORRHOID SURGERY; N/A     Comment:  Procedure: HEMORRHOIDECTOMY;  Surgeon: [name],              MD;  Location: [facility] ORS;  Service: General;  Laterality:               N/A; No date: HERNIA REPAIR; Bilateral     Comment:  20'S No date: KNEE ARTHROSCOPY W/ MENISCAL REPAIR; Right No date: TONSILLECTOMY AND ADENOIDECTOMY No date: TRIGGER FINGER RELEASE     Comment:  x3  BMI    Body Mass Index: 36.34 kg/m      Reproductive/Obstetrics negative OB ROS                             Anesthesia Physical Anesthesia Plan  ASA: 2  Anesthesia Plan: General LMA   Post-op Pain Management:    Induction:  Intravenous  PONV Risk Score and Plan: Dexamethasone, Ondansetron, Midazolam and Treatment may vary due to age or medical condition  Airway Management Planned: LMA  Additional Equipment:   Intra-op Plan:   Post-operative Plan: Extubation in OR  Informed Consent: I have reviewed the patients History and Physical, chart, labs and discussed the procedure including the risks, benefits and alternatives for the proposed anesthesia with the patient or authorized representative who has indicated his/her understanding and acceptance.     Dental Advisory Given  Plan Discussed with: Anesthesiologist, CRNA and Surgeon  Anesthesia Plan Comments: (Patient consented for risks of anesthesia including but not limited to:  - adverse reactions to medications - damage to eyes, teeth, lips or other oral mucosa - nerve damage due to positioning  - sore throat or hoarseness - Damage to heart, brain, nerves, lungs, other parts of body or loss of life  Patient voiced understanding.)        Anesthesia Quick Evaluation

## 2021-10-10 NOTE — Anesthesia Procedure Notes (Signed)
Procedure Name: LMA Insertion Date/Time: 10/10/2021 8:02 AM  Performed by: Biagio Borg, CRNAPre-anesthesia Checklist: Patient identified, Emergency Drugs available, Suction available and Patient being monitored Patient Re-evaluated:Patient Re-evaluated prior to induction Oxygen Delivery Method: Circle system utilized Preoxygenation: Pre-oxygenation with 100% oxygen Induction Type: IV induction Ventilation: Mask ventilation without difficulty LMA: LMA inserted LMA Size: 4.5 Tube type: Oral Number of attempts: 1 Placement Confirmation: positive ETCO2 and breath sounds checked- equal and bilateral Tube secured with: Tape Dental Injury: Teeth and Oropharynx as per pre-operative assessment

## 2021-10-11 ENCOUNTER — Encounter: Payer: Self-pay | Admitting: Specialist

## 2021-11-27 ENCOUNTER — Ambulatory Visit: Payer: 59 | Admitting: Podiatry

## 2021-11-27 DIAGNOSIS — B351 Tinea unguium: Secondary | ICD-10-CM

## 2021-11-27 NOTE — Progress Notes (Signed)
  Subjective:  Patient ID: Jonathan Mayer, male    DOB: 05/16/1960,  MRN: [id number]  Chief Complaint  Patient presents with   Nail Problem    6 month follow up nail fungus treatment right great toenail. He is very pleased with his results this visit. Took the last of his medication 2 weeks ago.    61 y.o. male returns for follow-up with the above complaint. History confirmed with patient.  Doing much better  Objective:  Physical Exam: warm, good capillary refill, no trophic changes or ulcerative lesions, normal DP and PT pulses, and normal sensory exam. Right Foot: Near complete resolution of mycosis        Assessment:   1. Onychomycosis       Plan:  Patient was evaluated and treated and all questions answered.  Doing much better.  He has had near complete resolution with fluconazole pulsed dosing.  He will return to see me as needed if this returns or worsens, I advised him he may call for refills of the fluconazole if he knows that it returns  Return if symptoms worsen or fail to improve.

## 2022-01-17 ENCOUNTER — Ambulatory Visit: Payer: 59 | Admitting: Nurse Practitioner

## 2022-01-17 ENCOUNTER — Encounter: Payer: Self-pay | Admitting: Nurse Practitioner

## 2022-01-17 VITALS — BP 134/82 | HR 65 | Temp 98.4°F | Ht 67.0 in | Wt 222.1 lb

## 2022-01-17 DIAGNOSIS — K219 Gastro-esophageal reflux disease without esophagitis: Secondary | ICD-10-CM | POA: Diagnosis not present

## 2022-01-17 MED ORDER — PANTOPRAZOLE SODIUM 20 MG PO TBEC: 20.0000 mg | DELAYED_RELEASE_TABLET | Freq: Two times a day (BID) | ORAL | 3 refills | Status: DC

## 2022-01-17 NOTE — Patient Instructions (Signed)

## 2022-01-17 NOTE — Assessment & Plan Note (Signed)
Chronic, ongoing for years.  At this time will stop Nexium and start Protonix 20 MG BID, for short term until can get into GI for further assessment.  Referral placed to GI.  Risks of PPI use were discussed with patient including bone loss, C. Diff diarrhea, pneumonia, infections, CKD, electrolyte abnormalities.  Verbalizes understanding and chooses to continue the medication.  Return in 6 weeks.

## 2022-01-17 NOTE — Progress Notes (Signed)
Acute Office Visit  Subjective:     Patient ID: Jonathan Mayer, male    DOB: June 27, 1960, 61 y.o.   MRN: [id number]  Chief Complaint  Patient presents with   Heartburn    Patient says he would like to have his Reflux checked. Patient says back in the 90's Dr.Crissman prescribed him a anti-acid and he was taking it for about 10 years. Patient says he was then on Nexium and has been on it for about 19 years. Patient noticed his reflux is getting worse again. Patient says he has changed his diet around and he has lost about 18-lbs. Patient says he is experiencing burning and collection in his larynx. Patient was told by GI to see PCP first.    As child had a highly acidic stomach, could not eat fried food.  Back in the 90's was on antacid  for about 10 years with Dr. Jeananne Rama.  Then took Nexium for 19 years.  At this time his reflux is worsening.  Has tried changing diet and lost 18 pounds.  At present experiencing burning and build up in larynx , was told by GI to come and see PCP first.  Foods are major trigger, fried food, spicy foods (chili), sweet tea, foods high in sugar.  History of upper GI 20 years ago, was recommended to lose weight.  Does not smoke or drink alcohol.  Heartburn He complains of globus sensation, heartburn and a hoarse voice (clearing throat often). He reports no abdominal pain, no belching, no chest pain, no coughing, no dysphagia, no early satiety, no nausea, no sore throat, no stridor, no water brash or no wheezing. This is a chronic problem. The current episode started more than 1 year ago. The problem has been gradually worsening. The heartburn duration is more than one hour. The heartburn is located in the substernum. The heartburn is of moderate intensity. The heartburn does not wake him from sleep. The heartburn does not limit his activity. The heartburn doesn't change with position. The symptoms are aggravated by certain foods and medications. Pertinent negatives  include no fatigue, melena, orthopnea or weight loss. Risk factors include obesity. He has tried an antacid, a diet change and an herbal remedy (Ginger tea) for the symptoms. The treatment provided mild relief. Past procedures include an EGD. Past procedures do not include an abdominal ultrasound or esophageal manometry. Past invasive treatments do not include reflux surgery.   Patient is in today for heart burn symptoms.  Review of Systems  Constitutional:  Negative for fatigue, fever, malaise/fatigue and weight loss.  HENT:  Positive for hoarse voice (clearing throat often). Negative for sore throat.   Respiratory:  Negative for cough and wheezing.   Cardiovascular:  Negative for chest pain, palpitations, orthopnea and leg swelling.  Gastrointestinal:  Positive for heartburn. Negative for abdominal pain, constipation, diarrhea, dysphagia, melena, nausea and vomiting.  Neurological: Negative.   Psychiatric/Behavioral: Negative.        Objective:    BP 134/82 (BP Location: Left Arm, Patient Position: Sitting, Cuff Size: Normal)   Pulse 65   Temp 98.4 F (36.9 C) (Oral)   Ht '5\' 7"'$  (1.702 m)   Wt 222 lb 1.6 oz (100.7 kg)   SpO2 96%   BMI 34.79 kg/m  BP Readings from Last 3 Encounters:  01/17/22 134/82  10/10/21 (!) 141/98  02/08/19 (!) 158/94   Wt Readings from Last 3 Encounters:  01/17/22 222 lb 1.6 oz (100.7 kg)  10/10/21 232 lb (  105.2 kg)  10/08/21 232 lb (105.2 kg)   Physical Exam Vitals and nursing note reviewed.  Constitutional:      General: He is awake. He is not in acute distress.    Appearance: He is well-developed and well-groomed. He is obese. He is not ill-appearing or toxic-appearing.  HENT:     Head: Normocephalic and atraumatic.     Right Ear: Hearing normal. No drainage.     Left Ear: Hearing normal. No drainage.  Eyes:     General: Lids are normal.        Right eye: No discharge.        Left eye: No discharge.     Conjunctiva/sclera: Conjunctivae  normal.     Pupils: Pupils are equal, round, and reactive to light.  Neck:     Thyroid: No thyromegaly.     Vascular: No carotid bruit.  Cardiovascular:     Rate and Rhythm: Normal rate and regular rhythm.     Heart sounds: Normal heart sounds, S1 normal and S2 normal. No murmur heard.    No gallop.  Pulmonary:     Effort: Pulmonary effort is normal. No accessory muscle usage or respiratory distress.     Breath sounds: Normal breath sounds.  Abdominal:     General: Bowel sounds are normal. There is no distension.     Palpations: Abdomen is soft.     Tenderness: There is no abdominal tenderness.     Hernia: No hernia is present.  Musculoskeletal:        General: Normal range of motion.     Cervical back: Normal range of motion and neck supple.     Right lower leg: No edema.     Left lower leg: No edema.  Skin:    General: Skin is warm and dry.     Capillary Refill: Capillary refill takes less than 2 seconds.     Findings: No rash.  Neurological:     Mental Status: He is alert and oriented to person, place, and time.     Deep Tendon Reflexes: Reflexes are normal and symmetric.     Reflex Scores:      Brachioradialis reflexes are 2+ on the right side and 2+ on the left side.      Patellar reflexes are 2+ on the right side and 2+ on the left side. Psychiatric:        Attention and Perception: Attention normal.        Mood and Affect: Mood normal.        Speech: Speech normal.        Behavior: Behavior normal. Behavior is cooperative.        Thought Content: Thought content normal.    No results found for any visits on 01/17/22.     Assessment & Plan:   Problem List Items Addressed This Visit       Digestive   Gastroesophageal reflux disease - Primary    Chronic, ongoing for years.  At this time will stop Nexium and start Protonix 20 MG BID, for short term until can get into GI for further assessment.  Referral placed to GI.  Risks of PPI use were discussed with patient  including bone loss, C. Diff diarrhea, pneumonia, infections, CKD, electrolyte abnormalities.  Verbalizes understanding and chooses to continue the medication.  Return in 6 weeks.      Relevant Medications   pantoprazole (PROTONIX) 20 MG tablet   Other Relevant Orders   Ambulatory  referral to Gastroenterology    Meds ordered this encounter  Medications   pantoprazole (PROTONIX) 20 MG tablet    Sig: Take 1 tablet (20 mg total) by mouth 2 (two) times daily before a meal.    Dispense:  60 tablet    Refill:  3    Return in about 6 weeks (around 02/28/2022) for GERD.  Venita Lick, NP

## 2022-01-20 ENCOUNTER — Telehealth: Payer: Self-pay

## 2022-01-20 MED ORDER — PANTOPRAZOLE SODIUM 40 MG PO TBEC: 40.0000 mg | DELAYED_RELEASE_TABLET | Freq: Every day | ORAL | 3 refills | Status: DC

## 2022-01-20 NOTE — Telephone Encounter (Signed)
[facility] faxed over requesting to change script, insurance will cover for 1 daily. Please advise.

## 2022-01-20 NOTE — Telephone Encounter (Signed)
Spoke with pharmacist at local pharmacy and was informed patient's insurance only covers 1 tablet once a day. Please advise?

## 2022-01-21 MED ORDER — PANTOPRAZOLE SODIUM 20 MG PO TBEC: 20.0000 mg | DELAYED_RELEASE_TABLET | Freq: Every day | ORAL | 3 refills | Status: DC

## 2022-01-21 NOTE — Telephone Encounter (Signed)
Updated prescription sent to the pharmacy. 

## 2022-01-28 NOTE — Progress Notes (Unsigned)
Gastroenterology Consultation  Referring Provider:     Venita Lick, NP Primary Care Physician:  Jon Billings, NP Primary Gastroenterologist:  Dr. Allen Norris     Reason for Consultation:     GERD        HPI:   Jonathan Mayer is a 61 y.o. y/o male referred for consultation & management of GERD by Dr. Jon Billings, NP.  This patient comes in today after being seen by his primary care provider for heartburn.  The patient had reported that he was on an antacid for about 10 years.  Even states that he was switched to Nexium and has been on that for close to 20 years.  He now was reported to his primary care provider that he felt that his acid reflux was getting worse.  The patient has changed his diet recently and has lost 18 pounds.  He had informed his primary care provider that he was burning in his throat.  He reports the last time he had any upper endoscopies was approximately 20 years ago.  The symptoms have been getting worse for more than a year.  There are some foods that make his symptoms worse including fried foods and spicy foods.  He also reports foods high in sugar cause him problems.    Past Medical History:  Diagnosis Date   Arthritis    Family history of adverse reaction to anesthesia    MOM-BP DROPPED   GERD (gastroesophageal reflux disease)    Headache    MIGRAINES   Pneumonia    X2 AGE 61 AND IN HIS 30'S    Past Surgical History:  Procedure Laterality Date   CARPOMETACARPAL ([place]) FUSION OF THUMB Left 10/10/2021   Procedure: CARPOMETACARPAL [facility]) FUSION OF THUMB;  Surgeon: Earnestine Leys, MD;  Location: [facility] ORS;  Service: Orthopedics;  Laterality: Left;   COLONOSCOPY  2015   ESOPHAGOGASTRODUODENOSCOPY     HEMORRHOID SURGERY N/A 05/25/2018   Procedure: HEMORRHOIDECTOMY;  Surgeon: [name], MD;  Location: [facility] ORS;  Service: General;  Laterality: N/A;   HERNIA REPAIR Bilateral    20'S   KNEE ARTHROSCOPY W/ MENISCAL REPAIR Right    TONSILLECTOMY AND  ADENOIDECTOMY     TRIGGER FINGER RELEASE     x3    Prior to Admission medications   Medication Sig Start Date End Date Taking? Authorizing Provider  fluticasone (FLONASE) 50 MCG/ACT nasal spray Place 1 spray into both nostrils every morning.     [provider]  pantoprazole (PROTONIX) 20 MG tablet Take 1 tablet (20 mg total) by mouth daily. 01/21/22   Jon Billings, NP    Family History  Problem Relation Age of Onset   Osteoporosis Mother    GER disease Father    Cancer Paternal Uncle    Hypertension Maternal Grandfather      Social History   Tobacco Use   Smoking status: Never   Smokeless tobacco: Never  Vaping Use   Vaping Use: Never used  Substance Use Topics   Alcohol use: No   Drug use: No    Allergies as of 01/29/2022 - Review Complete 01/17/2022  Allergen Reaction Noted   Aspirin Itching and Other (See Comments) 11/18/2013   Ibuprofen Itching and Other (See Comments) 11/18/2013   Terbinafine and related Hives 01/16/2021    Review of Systems:    All systems reviewed and negative except where noted in HPI.   Physical Exam:  There were no vitals taken for this visit.  No LMP for male patient. General:   Alert,  Well-developed, well-nourished, pleasant and cooperative in NAD Head:  Normocephalic and atraumatic. Eyes:  Sclera clear, no icterus.   Conjunctiva pink. Ears:  Normal auditory acuity. Neck:  Supple; no masses or thyromegaly. Lungs:  Respirations even and unlabored.  Clear throughout to auscultation.   No wheezes, crackles, or rhonchi. No acute distress. Heart:  Regular rate and rhythm; no murmurs, clicks, rubs, or gallops. Abdomen:  Normal bowel sounds.  No bruits.  Soft, non-tender and non-distended without masses, hepatosplenomegaly or hernias noted.  No guarding or rebound tenderness.  Negative Carnett sign.   Rectal:  Deferred.  Pulses:  Normal pulses noted. Extremities:  No clubbing or edema.  No cyanosis. Neurologic:  Alert and  oriented x3;  grossly normal neurologically. Skin:  Intact without significant lesions or rashes.  No jaundice. Lymph Nodes:  No significant cervical adenopathy. Psych:  Alert and cooperative. Normal mood and affect.  Imaging Studies: No results found.  Assessment and Plan:   Jonathan Mayer is a 61 y.o. y/o male ***    Lucilla Lame, MD. Marval Regal    Note: This dictation was prepared with Dragon dictation along with smaller phrase technology. Any transcriptional errors that result from this process are unintentional.

## 2022-01-29 ENCOUNTER — Encounter: Payer: Self-pay | Admitting: Gastroenterology

## 2022-01-29 ENCOUNTER — Ambulatory Visit (INDEPENDENT_AMBULATORY_CARE_PROVIDER_SITE_OTHER): Payer: 59 | Admitting: Gastroenterology

## 2022-01-29 VITALS — BP 138/89 | HR 68 | Temp 98.2°F | Ht 67.0 in | Wt 220.0 lb

## 2022-01-29 DIAGNOSIS — Z1211 Encounter for screening for malignant neoplasm of colon: Secondary | ICD-10-CM

## 2022-01-29 DIAGNOSIS — K219 Gastro-esophageal reflux disease without esophagitis: Secondary | ICD-10-CM

## 2022-01-29 MED ORDER — NA SULFATE-K SULFATE-MG SULF 17.5-3.13-1.6 GM/177ML PO SOLN: 1.0000 | Freq: Once | ORAL | 0 refills | Status: AC

## 2022-01-29 MED ORDER — DEXLANSOPRAZOLE 60 MG PO CPDR: 60.0000 mg | DELAYED_RELEASE_CAPSULE | Freq: Every day | ORAL | 5 refills | Status: DC

## 2022-02-03 ENCOUNTER — Ambulatory Visit: Payer: 59 | Admitting: Anesthesiology

## 2022-02-03 ENCOUNTER — Encounter: Payer: Self-pay | Admitting: Gastroenterology

## 2022-02-03 ENCOUNTER — Ambulatory Visit
Admission: RE | Admit: 2022-02-03 | Discharge: 2022-02-03 | Disposition: A | Discharge status: Home / Self Care | Payer: 59 | Attending: Gastroenterology | Admitting: Gastroenterology

## 2022-02-03 ENCOUNTER — Encounter
Admission: RE | Disposition: A | Discharge status: Home / Self Care | Payer: Self-pay | Source: Home / Self Care | Attending: Gastroenterology

## 2022-02-03 ENCOUNTER — Other Ambulatory Visit: Payer: Self-pay

## 2022-02-03 DIAGNOSIS — D122 Benign neoplasm of ascending colon: Secondary | ICD-10-CM | POA: Insufficient documentation

## 2022-02-03 DIAGNOSIS — K5730 Diverticulosis of large intestine without perforation or abscess without bleeding: Secondary | ICD-10-CM | POA: Insufficient documentation

## 2022-02-03 DIAGNOSIS — K449 Diaphragmatic hernia without obstruction or gangrene: Secondary | ICD-10-CM | POA: Insufficient documentation

## 2022-02-03 DIAGNOSIS — Z1211 Encounter for screening for malignant neoplasm of colon: Secondary | ICD-10-CM | POA: Insufficient documentation

## 2022-02-03 DIAGNOSIS — K219 Gastro-esophageal reflux disease without esophagitis: Secondary | ICD-10-CM | POA: Insufficient documentation

## 2022-02-03 DIAGNOSIS — K635 Polyp of colon: Secondary | ICD-10-CM | POA: Diagnosis not present

## 2022-02-03 DIAGNOSIS — E669 Obesity, unspecified: Secondary | ICD-10-CM | POA: Insufficient documentation

## 2022-02-03 DIAGNOSIS — Z6833 Body mass index (BMI) 33.0-33.9, adult: Secondary | ICD-10-CM | POA: Insufficient documentation

## 2022-02-03 DIAGNOSIS — K640 First degree hemorrhoids: Secondary | ICD-10-CM | POA: Diagnosis not present

## 2022-02-03 HISTORY — PX: ESOPHAGOGASTRODUODENOSCOPY (EGD) WITH PROPOFOL: SHX5813

## 2022-02-03 HISTORY — PX: COLONOSCOPY WITH PROPOFOL: SHX5780

## 2022-02-03 HISTORY — PX: POLYPECTOMY: SHX5525

## 2022-02-03 SURGERY — COLONOSCOPY WITH PROPOFOL
Anesthesia: General | Site: Rectum

## 2022-02-03 MED ORDER — LIDOCAINE HCL (CARDIAC) PF 100 MG/5ML IV SOSY
PREFILLED_SYRINGE | INTRAVENOUS | Status: DC | PRN
  Administered 2022-02-03: 80 mg via INTRAVENOUS

## 2022-02-03 MED ORDER — STERILE WATER FOR IRRIGATION IR SOLN
Status: DC | PRN
  Administered 2022-02-03: 150 mL

## 2022-02-03 MED ORDER — PROPOFOL 10 MG/ML IV BOLUS
INTRAVENOUS | Status: DC | PRN
  Administered 2022-02-03: 50 mg via INTRAVENOUS
  Administered 2022-02-03 (×3): 20 mg via INTRAVENOUS
  Administered 2022-02-03: 50 mg via INTRAVENOUS
  Administered 2022-02-03: 80 mg via INTRAVENOUS

## 2022-02-03 MED ORDER — LACTATED RINGERS IV SOLN: INTRAVENOUS | Status: DC

## 2022-02-03 SURGICAL SUPPLY — 36 items
BALLN DILATOR 12-15 8 (BALLOONS)
BALLN DILATOR 15-18 8 (BALLOONS)
BALLN DILATOR CRE 0-12 8 (BALLOONS)
BALLN DILATOR ESOPH 8 10 CRE (MISCELLANEOUS) IMPLANT
BALLOON DILATOR 12-15 8 (BALLOONS) IMPLANT
BALLOON DILATOR 15-18 8 (BALLOONS) IMPLANT
BALLOON DILATOR CRE 0-12 8 (BALLOONS) IMPLANT
BLOCK BITE 60FR ADLT L/F GRN (MISCELLANEOUS) ×2 IMPLANT
CLIP HMST 235XBRD CATH ROT (MISCELLANEOUS) IMPLANT
CLIP RESOLUTION 360 11X235 (MISCELLANEOUS)
ELECT REM PT RETURN 9FT ADLT (ELECTROSURGICAL)
ELECTRODE REM PT RTRN 9FT ADLT (ELECTROSURGICAL) IMPLANT
FCP ESCP3.2XJMB 240X2.8X (MISCELLANEOUS)
FORCEPS BIOP RAD 4 LRG CAP 4 (CUTTING FORCEPS) IMPLANT
FORCEPS BIOP RJ4 240 W/NDL (MISCELLANEOUS)
FORCEPS ESCP3.2XJMB 240X2.8X (MISCELLANEOUS) IMPLANT
GOWN CVR UNV OPN BCK APRN NK (MISCELLANEOUS) ×8 IMPLANT
GOWN ISOL THUMB LOOP REG UNIV (MISCELLANEOUS) ×8
INJECTOR VARIJECT VIN23 (MISCELLANEOUS) IMPLANT
KIT DEFENDO VALVE AND CONN (KITS) IMPLANT
KIT PRC NS LF DISP ENDO (KITS) ×4 IMPLANT
KIT PROCEDURE OLYMPUS (KITS) ×4
MANIFOLD NEPTUNE II (INSTRUMENTS) ×4 IMPLANT
MARKER SPOT ENDO TATTOO 5ML (MISCELLANEOUS) IMPLANT
PROBE APC STR FIRE (PROBE) IMPLANT
RETRIEVER NET PLAT FOOD (MISCELLANEOUS) IMPLANT
RETRIEVER NET ROTH 2.5X230 LF (MISCELLANEOUS) IMPLANT
SNARE COLD EXACTO (MISCELLANEOUS) IMPLANT
SNARE SHORT THROW 13M SML OVAL (MISCELLANEOUS) IMPLANT
SNARE SHORT THROW 30M LRG OVAL (MISCELLANEOUS) IMPLANT
SNARE SNG USE RND 15MM (INSTRUMENTS) IMPLANT
SYR INFLATION 60ML (SYRINGE) IMPLANT
TRAP ETRAP POLY (MISCELLANEOUS) IMPLANT
VARIJECT INJECTOR VIN23 (MISCELLANEOUS)
WATER STERILE IRR 250ML POUR (IV SOLUTION) ×4 IMPLANT
WIRE CRE 18-20MM 8CM F G (MISCELLANEOUS) IMPLANT

## 2022-02-03 NOTE — Op Note (Signed)
[facility] Patient Name: Jonathan Mayer Procedure Date: 02/03/2022 11:01 AM MRN: [id number] Account #: [id number] Date of Birth: Apr 07, 1960 Admit Type: Outpatient Age: 61 Room: [facility] OR ROOM 1 Gender: Male Note Status: Finalized Instrument Name: [id number] Procedure:             Upper GI endoscopy Indications:           Heartburn Providers:             Lucilla Lame MD, MD Referring MD:          Jon Billings (Referring MD) Medicines:             Propofol per Anesthesia Complications:         No immediate complications. Procedure:             Pre-Anesthesia Assessment:                        - Prior to the procedure, a History and Physical was                         performed, and patient medications and allergies were                         reviewed. The patient's tolerance of previous                         anesthesia was also reviewed. The risks and benefits                         of the procedure and the sedation options and risks                         were discussed with the patient. All questions were                         answered, and informed consent was obtained. Prior                         Anticoagulants: The patient has taken no anticoagulant                         or antiplatelet agents. ASA Grade Assessment: II - A                         patient with mild systemic disease. After reviewing                         the risks and benefits, the patient was deemed in                         satisfactory condition to undergo the procedure.                        After obtaining informed consent, the endoscope was                         passed under direct vision. Throughout the procedure,  the patient's blood pressure, pulse, and oxygen                         saturations were monitored continuously. The was                         introduced through the mouth, and advanced to the                          second part of duodenum. The upper GI endoscopy was                         accomplished without difficulty. The patient tolerated                         the procedure well. Findings:      The Z-line was irregular.      A small hiatal hernia was present.      The stomach was normal.      The examined duodenum was normal. Impression:            - Z-line irregular.                        - Small hiatal hernia.                        - Normal stomach.                        - Normal examined duodenum.                        - No specimens collected. Recommendation:        - Discharge patient to home.                        - Resume previous diet.                        - Continue present medications.                        - Perform a colonoscopy today. Procedure Code(s):     --- Professional ---                        [phone], Esophagogastroduodenoscopy, flexible,                         transoral; diagnostic, including collection of                         specimen(s) by brushing or washing, when performed                         (separate procedure) Diagnosis Code(s):     --- Professional ---                        R12, Heartburn                        K22.89, Other specified disease of  esophagus CPT copyright 2022 American [employer] Association. All rights reserved. The codes documented in this report are preliminary and upon coder review may  be revised to meet current compliance requirements. Lucilla Lame MD, MD 02/03/2022 11:13:01 AM This report has been signed electronically. Number of Addenda: 0 Note Initiated On: 02/03/2022 11:01 AM Estimated Blood Loss:  Estimated blood loss: none.      [facility]

## 2022-02-03 NOTE — Anesthesia Preprocedure Evaluation (Signed)
Anesthesia Evaluation  Patient identified by MRN, date of birth, ID band Patient awake    Reviewed: Allergy & Precautions, NPO status , Patient's Chart, lab work & pertinent test results  History of Anesthesia Complications Negative for: history of anesthetic complications  Airway Mallampati: III  TM Distance: <3 FB Neck ROM: full    Dental  (+) Chipped   Pulmonary neg pulmonary ROS, neg shortness of breath   Pulmonary exam normal        Cardiovascular Exercise Tolerance: Good (-) angina (-) Past MI and (-) DOE negative cardio ROS Normal cardiovascular exam     Neuro/Psych  Headaches  negative psych ROS   GI/Hepatic Neg liver ROS,GERD  Controlled,,  Endo/Other  negative endocrine ROS    Renal/GU      Musculoskeletal   Abdominal  (+) + obese  Peds  Hematology negative hematology ROS (+)   Anesthesia Other Findings Past Medical History: No date: Arthritis No date: Family history of adverse reaction to anesthesia     Comment:  MOM-BP DROPPED No date: GERD (gastroesophageal reflux disease) No date: Headache     Comment:  MIGRAINES No date: Pneumonia     Comment:  X2 AGE 61 AND IN HIS 30'S  Past Surgical History: 2015: COLONOSCOPY No date: ESOPHAGOGASTRODUODENOSCOPY 05/25/2018: HEMORRHOID SURGERY; N/A     Comment:  Procedure: HEMORRHOIDECTOMY;  Surgeon: [name],              MD;  Location: [facility] ORS;  Service: General;  Laterality:               N/A; No date: HERNIA REPAIR; Bilateral     Comment:  20'S No date: KNEE ARTHROSCOPY W/ MENISCAL REPAIR; Right No date: TONSILLECTOMY AND ADENOIDECTOMY No date: TRIGGER FINGER RELEASE     Comment:  x3  BMI    Body Mass Index: 36.34 kg/m      Reproductive/Obstetrics negative OB ROS                              Anesthesia Physical Anesthesia Plan  ASA: 2  Anesthesia Plan: General LMA   Post-op Pain Management: Minimal or  no pain anticipated   Induction: Intravenous  PONV Risk Score and Plan: 2 and Dexamethasone, Ondansetron, Midazolam and Treatment may vary due to age or medical condition  Airway Management Planned: LMA  Additional Equipment: None  Intra-op Plan:   Post-operative Plan: Extubation in OR  Informed Consent: I have reviewed the patients History and Physical, chart, labs and discussed the procedure including the risks, benefits and alternatives for the proposed anesthesia with the patient or authorized representative who has indicated his/her understanding and acceptance.     Dental Advisory Given  Plan Discussed with: Anesthesiologist, CRNA and Surgeon  Anesthesia Plan Comments: (Patient consented for risks of anesthesia including but not limited to:  - adverse reactions to medications - damage to eyes, teeth, lips or other oral mucosa - nerve damage due to positioning  - sore throat or hoarseness - Damage to heart, brain, nerves, lungs, other parts of body or loss of life  Patient voiced understanding.)         Anesthesia Quick Evaluation

## 2022-02-03 NOTE — Op Note (Signed)
[facility] Gastroenterology Patient Name: Jonathan Mayer Procedure Date: 02/03/2022 11:00 AM MRN: [id number] Account #: [id number] Date of Birth: 01-30-1961 Admit Type: Outpatient Age: 61 Room: [facility] OR ROOM 01 Gender: Male Note Status: Finalized Instrument Name: [id number] Procedure:             Colonoscopy Indications:           Screening for colorectal malignant neoplasm Providers:             Lucilla Lame MD, MD Referring MD:          Jon Billings (Referring MD) Medicines:             Propofol per Anesthesia Complications:         No immediate complications. Procedure:             Pre-Anesthesia Assessment:                        - Prior to the procedure, a History and Physical was                         performed, and patient medications and allergies were                         reviewed. The patient's tolerance of previous                         anesthesia was also reviewed. The risks and benefits                         of the procedure and the sedation options and risks                         were discussed with the patient. All questions were                         answered, and informed consent was obtained. Prior                         Anticoagulants: The patient has taken no anticoagulant                         or antiplatelet agents. ASA Grade Assessment: II - A                         patient with mild systemic disease. After reviewing                         the risks and benefits, the patient was deemed in                         satisfactory condition to undergo the procedure.                        After obtaining informed consent, the colonoscope was                         passed under direct vision. Throughout the procedure,  the patient's blood pressure, pulse, and oxygen                         saturations were monitored continuously. The was                         introduced through the anus and advanced to the  the                         cecum, identified by appendiceal orifice and ileocecal                         valve. The colonoscopy was performed without                         difficulty. The patient tolerated the procedure well.                         The quality of the bowel preparation was excellent. Findings:      The perianal and digital rectal examinations were normal.      A 10 mm polyp was found in the proximal ascending colon. The polyp was       sessile. The polyp was removed with a piecemeal technique using a cold       snare. Resection and retrieval were complete.      A few small-mouthed diverticula were found in the entire colon.      Non-bleeding internal hemorrhoids were found during retroflexion. The       hemorrhoids were Grade I (internal hemorrhoids that do not prolapse). Impression:            - One 10 mm polyp in the proximal ascending colon,                         removed piecemeal using a cold snare. Resected and                         retrieved.                        - Diverticulosis in the entire examined colon.                        - Non-bleeding internal hemorrhoids. Recommendation:        - Discharge patient to home.                        - Resume previous diet.                        - Continue present medications.                        - Await pathology results.                        - If the pathology report reveals adenomatous tissue,                         then repeat the colonoscopy for surveillance in 1 year. Procedure Code(s):     ---  Professional ---                        [phone], Colonoscopy, flexible; with removal of                         tumor(s), polyp(s), or other lesion(s) by snare                         technique Diagnosis Code(s):     --- Professional ---                        Z12.11, Encounter for screening for malignant neoplasm                         of colon                        D12.2, Benign neoplasm of ascending  colon CPT copyright 2022 American Medical Association. All rights reserved. The codes documented in this report are preliminary and upon coder review may  be revised to meet current compliance requirements. Lucilla Lame MD, MD 02/03/2022 11:31:34 AM This report has been signed electronically. Number of Addenda: 0 Note Initiated On: 02/03/2022 11:00 AM Scope Withdrawal Time: 0 hours 12 minutes 57 seconds  Total Procedure Duration: 0 hours 15 minutes 8 seconds  Estimated Blood Loss:  Estimated blood loss: none.      [facility]

## 2022-02-03 NOTE — Anesthesia Postprocedure Evaluation (Signed)
Anesthesia Post Note  Patient: Jonathan Mayer  Procedure(s) Performed: COLONOSCOPY WITH PROPOFOL POLYPECTOMY (Rectum) ESOPHAGOGASTRODUODENOSCOPY (EGD) WITH PROPOFOL  Patient location during evaluation: Endoscopy Anesthesia Type: General Level of consciousness: awake and alert Pain management: pain level controlled Vital Signs Assessment: post-procedure vital signs reviewed and stable Respiratory status: spontaneous breathing, nonlabored ventilation, respiratory function stable and patient connected to nasal cannula oxygen Cardiovascular status: blood pressure returned to baseline and stable Postop Assessment: no apparent nausea or vomiting Anesthetic complications: no   No notable events documented.   Last Vitals:  Vitals:   02/03/22 1140 02/03/22 1141  BP:  (!) 134/90  Pulse: 65 64  Resp: 12 13  Temp:    SpO2: 97% 98%    Last Pain:  Vitals:   02/03/22 1141  TempSrc:   PainSc: 0-No pain                 Arita Miss

## 2022-02-03 NOTE — H&P (Signed)
Lucilla Lame, MD [facility] [address], [place], [place] [address] Phone:[phone] Fax : [phone]  Primary Care Physician:  Jon Billings, NP Primary Gastroenterologist:  Dr. Allen Norris  Pre-Procedure History & Physical: HPI:  Jonathan Mayer is a 61 y.o. male is here for an endoscopy and colonoscopy.   Past Medical History:  Diagnosis Date   Arthritis    Family history of adverse reaction to anesthesia    MOM-BP DROPPED   GERD (gastroesophageal reflux disease)    Headache    MIGRAINES   Pneumonia    X2 AGE 13 AND IN HIS 30'S    Past Surgical History:  Procedure Laterality Date   CARPOMETACARPAL ([place]) FUSION OF THUMB Left 10/10/2021   Procedure: CARPOMETACARPAL [facility]) FUSION OF THUMB;  Surgeon: Earnestine Leys, MD;  Location: [facility] ORS;  Service: Orthopedics;  Laterality: Left;   COLONOSCOPY  2015   ESOPHAGOGASTRODUODENOSCOPY     HEMORRHOID SURGERY N/A 05/25/2018   Procedure: HEMORRHOIDECTOMY;  Surgeon: [name], MD;  Location: [facility] ORS;  Service: General;  Laterality: N/A;   HERNIA REPAIR Bilateral    20'S   KNEE ARTHROSCOPY W/ MENISCAL REPAIR Right    TONSILLECTOMY AND ADENOIDECTOMY     TRIGGER FINGER RELEASE     x3    Prior to Admission medications   Medication Sig Start Date End Date Taking? Authorizing Provider  dexlansoprazole (DEXILANT) 60 MG capsule Take 1 capsule (60 mg total) by mouth daily. 01/29/22  Yes Lucilla Lame, MD  fluticasone (FLONASE) 50 MCG/ACT nasal spray Place 1 spray into both nostrils every morning.    Yes [provider]    Allergies as of 01/29/2022 - Review Complete 01/29/2022  Allergen Reaction Noted   Aspirin Itching and Other (See Comments) 11/18/2013   Ibuprofen Itching and Other (See Comments) 11/18/2013   Terbinafine and related Hives 01/16/2021    Family History  Problem Relation Age of Onset   Osteoporosis Mother    GER disease Father    Cancer Paternal Uncle    Hypertension Maternal Grandfather      Social History   Socioeconomic History   Marital status: Married    Spouse name: Not on file   Number of children: Not on file   Years of education: Not on file   Highest education level: Not on file  Occupational History   Not on file  Tobacco Use   Smoking status: Never   Smokeless tobacco: Never  Vaping Use   Vaping Use: Never used  Substance and Sexual Activity   Alcohol use: No   Drug use: No   Sexual activity: Yes  Other Topics Concern   Not on file  Social History Narrative   Not on file   Social Determinants of Health   Financial Resource Strain: Not on file  Food Insecurity: Not on file  Transportation Needs: Not on file  Physical Activity: Not on file  Stress: Not on file  Social Connections: Not on file  Intimate Partner Violence: Not on file    Review of Systems: See HPI, otherwise negative ROS  Physical Exam: BP (!) 157/97   Temp 99.9 F (37.7 C) (Temporal)   Ht '5\' 7"'$  (1.702 m)   Wt 97 kg   SpO2 99%   BMI 33.49 kg/m  General:   Alert,  pleasant and cooperative in NAD Head:  Normocephalic and atraumatic. Neck:  Supple; no masses or thyromegaly. Lungs:  Clear throughout to auscultation.    Heart:  Regular rate and  rhythm. Abdomen:  Soft, nontender and nondistended. Normal bowel sounds, without guarding, and without rebound.   Neurologic:  Alert and  oriented x4;  grossly normal neurologically.  Impression/Plan: Jonathan Mayer is here for an endoscopy and colonoscopy to be performed for GERD and screening  Risks, benefits, limitations, and alternatives regarding  endoscopy and colonoscopy have been reviewed with the patient.  Questions have been answered.  All parties agreeable.   Lucilla Lame, MD  02/03/2022, 10:52 AM

## 2022-02-03 NOTE — Transfer of Care (Signed)
Immediate Anesthesia Transfer of Care Note  Patient: Jonathan Mayer  Procedure(s) Performed: COLONOSCOPY WITH PROPOFOL ESOPHAGOGASTRODUODENOSCOPY (EGD) WITH PROPOFOL  Patient Location: PACU  Anesthesia Type: No value filed.  Level of Consciousness: awake, alert  and patient cooperative  Airway and Oxygen Therapy: Patient Spontanous Breathing and Patient connected to supplemental oxygen  Post-op Assessment: Post-op Vital signs reviewed, Patient's Cardiovascular Status Stable, Respiratory Function Stable, Patent Airway and No signs of Nausea or vomiting  Post-op Vital Signs: Reviewed and stable  Complications: No notable events documented.

## 2022-02-04 ENCOUNTER — Encounter: Payer: Self-pay | Admitting: Gastroenterology

## 2022-02-05 LAB — SURGICAL PATHOLOGY

## 2022-02-06 ENCOUNTER — Encounter: Payer: Self-pay | Admitting: Gastroenterology

## 2022-02-11 ENCOUNTER — Telehealth: Payer: Self-pay

## 2022-02-11 DIAGNOSIS — K219 Gastro-esophageal reflux disease without esophagitis: Secondary | ICD-10-CM

## 2022-02-11 NOTE — Telephone Encounter (Signed)
Pt lmovm requesting call back regarding Dexilant... Pt reports having a rash since yesterday after starting Dexilant and now scalp is itchy and he is still experiencing GERD Sx, no relief...  Pt wanted to know if he can just restart Nexium... Or does he need to be referred regarding his hiatal hernia as he recalls discussing with you

## 2022-02-20 NOTE — Addendum Note (Signed)
Addended by: Lurlean Nanny on: 02/20/2022 02:41 PM   Modules accepted: Orders

## 2022-02-20 NOTE — Telephone Encounter (Signed)
Pt is agreeable to referral to [facility] for consult anti-reflux surgery

## 2022-02-23 NOTE — Patient Instructions (Signed)

## 2022-02-28 ENCOUNTER — Encounter: Payer: Self-pay | Admitting: Nurse Practitioner

## 2022-02-28 ENCOUNTER — Ambulatory Visit: Payer: 59 | Admitting: Nurse Practitioner

## 2022-02-28 VITALS — BP 128/70 | HR 71 | Temp 97.9°F | Ht 67.01 in | Wt 218.9 lb

## 2022-02-28 DIAGNOSIS — E6609 Other obesity due to excess calories: Secondary | ICD-10-CM | POA: Diagnosis not present

## 2022-02-28 DIAGNOSIS — K219 Gastro-esophageal reflux disease without esophagitis: Secondary | ICD-10-CM | POA: Diagnosis not present

## 2022-02-28 DIAGNOSIS — N400 Enlarged prostate without lower urinary tract symptoms: Secondary | ICD-10-CM | POA: Diagnosis not present

## 2022-02-28 DIAGNOSIS — Z136 Encounter for screening for cardiovascular disorders: Secondary | ICD-10-CM

## 2022-02-28 DIAGNOSIS — Z13220 Encounter for screening for lipoid disorders: Secondary | ICD-10-CM

## 2022-02-28 DIAGNOSIS — R030 Elevated blood-pressure reading, without diagnosis of hypertension: Secondary | ICD-10-CM | POA: Insufficient documentation

## 2022-02-28 DIAGNOSIS — Z6834 Body mass index (BMI) 34.0-34.9, adult: Secondary | ICD-10-CM

## 2022-02-28 NOTE — Progress Notes (Signed)
BP 128/70 (BP Location: Left Arm, Patient Position: Sitting, Cuff Size: Normal)   Pulse 71   Temp 97.9 F (36.6 C) (Oral)   Ht 5' 7.01" (1.702 m)   Wt 218 lb 14.4 oz (99.3 kg)   SpO2 99%   BMI 34.28 kg/m    Subjective:    Patient ID: Jonathan Mayer, male    DOB: [birth year], 61 y.o.   MRN: [id number]  HPI: Jonathan Mayer is a 62 y.o. male  Chief Complaint  Patient presents with   Gastroesophageal Reflux    Here for follow up   GERD Switched to Protonix on 01/17/22 by this Probation officer.  Was seen by GI on 01/29/22 and started on Dexilant.  This caused worsening symptoms and rash -- he was told by GI to stop this.  Has known hiatal hernia. Had a endoscopy/colonoscopy on 02/03/22 -- found one precancerous polyp x 1.  He is scheduled to see general surgery on Monday for consultation about hiatal hernia -- small noted on endoscopy.  Currently taking OTC Nexium and ginger tea. GERD control status: uncontrolled Satisfied with current treatment? yes Heartburn frequency: every day Medication side effects: with Dexilant only Medication compliance: fluctuating Previous GERD medications: Dexilant, Protonix, Nexium, Omeprazole Dysphagia: no Odynophagia:  no Hematemesis: no Blood in stool: no EGD: yes   Relevant past medical, surgical, family and social history reviewed and updated as indicated. Interim medical history since our last visit reviewed. Allergies and medications reviewed and updated.  Review of Systems  Constitutional:  Negative for activity change, diaphoresis, fatigue and fever.  Respiratory:  Negative for cough, chest tightness, shortness of breath and wheezing.   Cardiovascular:  Negative for chest pain, palpitations and leg swelling.  Gastrointestinal:  Negative for abdominal distention, abdominal pain, constipation, diarrhea, nausea and vomiting.  Neurological: Negative.   Psychiatric/Behavioral: Negative.     Per HPI unless specifically indicated above      Objective:    BP 128/70 (BP Location: Left Arm, Patient Position: Sitting, Cuff Size: Normal)   Pulse 71   Temp 97.9 F (36.6 C) (Oral)   Ht 5' 7.01" (1.702 m)   Wt 218 lb 14.4 oz (99.3 kg)   SpO2 99%   BMI 34.28 kg/m   Wt Readings from Last 3 Encounters:  02/28/22 218 lb 14.4 oz (99.3 kg)  02/03/22 213 lb 13.5 oz (97 kg)  01/29/22 220 lb (99.8 kg)    Physical Exam Vitals and nursing note reviewed.  Constitutional:      General: He is awake. He is not in acute distress.    Appearance: He is well-developed and well-groomed. He is obese. He is not ill-appearing or toxic-appearing.  HENT:     Head: Normocephalic and atraumatic.     Right Ear: Hearing normal. No drainage.     Left Ear: Hearing normal. No drainage.  Eyes:     General: Lids are normal.        Right eye: No discharge.        Left eye: No discharge.     Conjunctiva/sclera: Conjunctivae normal.     Pupils: Pupils are equal, round, and reactive to light.  Neck:     Thyroid: No thyromegaly.     Vascular: No carotid bruit.  Cardiovascular:     Rate and Rhythm: Normal rate and regular rhythm.     Heart sounds: Normal heart sounds, S1 normal and S2 normal. No murmur heard.    No gallop.  Pulmonary:  Effort: Pulmonary effort is normal. No accessory muscle usage or respiratory distress.     Breath sounds: Normal breath sounds.  Abdominal:     General: Bowel sounds are normal. There is no distension.     Palpations: Abdomen is soft.     Tenderness: There is no abdominal tenderness.     Hernia: No hernia is present.  Musculoskeletal:        General: Normal range of motion.     Cervical back: Normal range of motion and neck supple.     Right lower leg: No edema.     Left lower leg: No edema.  Skin:    General: Skin is warm and dry.     Capillary Refill: Capillary refill takes less than 2 seconds.     Findings: No rash.  Neurological:     Mental Status: He is alert and oriented to person, place, and time.      Deep Tendon Reflexes: Reflexes are normal and symmetric.     Reflex Scores:      Brachioradialis reflexes are 2+ on the right side and 2+ on the left side.      Patellar reflexes are 2+ on the right side and 2+ on the left side. Psychiatric:        Attention and Perception: Attention normal.        Mood and Affect: Mood normal.        Speech: Speech normal.        Behavior: Behavior normal. Behavior is cooperative.        Thought Content: Thought content normal.     Results for orders placed or performed during the hospital encounter of 02/03/22  Surgical pathology  Result Value Ref Range   SURGICAL PATHOLOGY      SURGICAL PATHOLOGY CASE: [id number] PATIENT: [name] Surgical Pathology Report     Specimen Submitted: A. Colon polyp, ascending; cold snare  Clinical History: GERD K21.9, screening Z12.11.  Hiatal hernia, colon polyp    DIAGNOSIS: A. COLON POLYP, ASCENDING; COLD SNARE: - SESSILE SERRATED POLYP. - NEGATIVE FOR DYSPLASIA AND MALIGNANCY.  GROSS DESCRIPTION: A. Labeled: Ascending colon polyp cold snare Received: Formalin Collection time: 10:55 AM on 02/03/2022 Placed into formalin time: 10:55 AM on 02/03/2022 Tissue fragment(s): Multiple Size: Aggregate, 2 x 0.6 x 0.2 cm Description: Received are fragments of tan soft tissue admixed with intestinal debris.  The ratio of soft tissue to intestinal debris is 95: 5. Entirely submitted in 1 cassette.  RB 02/04/2022  Final Diagnosis performed by Allena Napoleon, MD.   Electronically signed 02/05/2022 11:07:52AM The electronic signature indicates that the named Attending Pathologist has evaluated the spe cimen Technical component performed at [facility], [address], [place], [place] [address] Lab: [phone] Dir: Rush Farmer, MD, MMM  Professional component performed at [facility], [facility], [place], [place], [place] [address] Lab: [phone] Dir: Kathi Simpers, MD        Assessment & Plan:   Problem List Items Addressed This Visit       Digestive   Gastroesophageal reflux disease - Primary    Chronic, ongoing for years.  At this time continue Nexium at home + collaboration with GI provider.  Appreciate their input.  Labs today.      Relevant Orders   CBC with Differential/Platelet   TSH     Other   Elevated BP without diagnosis of hypertension    BP initially elevated on checks, with repeat at goal.  Similar to previous visits.  Suspect some white coat syndrome.  Recommend continued focus on diet at home, especially DASH diet, and monitor BP regularly at home/document for visits.  Labs today.      Obesity    BMI 34.28. Recommended eating smaller high protein, low fat meals more frequently and exercising 30 mins a day 5 times a week with a goal of 10-15lb weight loss in the next 3 months. Patient voiced their understanding and motivation to adhere to these recommendations.       Other Visit Diagnoses     Benign prostatic hyperplasia without lower urinary tract symptoms       PSA on labs today.   Relevant Orders   PSA   Encounter for lipid screening for cardiovascular disease       Lipid panel on labs today -- only ate apple this morning.   Relevant Orders   Comprehensive metabolic panel   Lipid Panel w/o Chol/HDL Ratio        Follow up plan: Return in about 6 months (around 08/30/2022) for HTN -- GERD.

## 2022-02-28 NOTE — Assessment & Plan Note (Addendum)
Chronic, ongoing for years.  At this time continue Nexium at home + collaboration with GI provider.  Appreciate their input.  Labs today.

## 2022-02-28 NOTE — Assessment & Plan Note (Signed)
BMI 34.28. Recommended eating smaller high protein, low fat meals more frequently and exercising 30 mins a day 5 times a week with a goal of 10-15lb weight loss in the next 3 months. Patient voiced their understanding and motivation to adhere to these recommendations.

## 2022-02-28 NOTE — Assessment & Plan Note (Signed)
BP initially elevated on checks, with repeat at goal.  Similar to previous visits.  Suspect some white coat syndrome.  Recommend continued focus on diet at home, especially DASH diet, and monitor BP regularly at home/document for visits.  Labs today.

## 2022-03-01 LAB — CBC WITH DIFFERENTIAL/PLATELET
Basophils Absolute: 0 10*3/uL (ref 0.0–0.2)
Basos: 1 %
EOS (ABSOLUTE): 0.1 10*3/uL (ref 0.0–0.4)
Eos: 2 %
Hematocrit: 47.8 % (ref 37.5–51.0)
Hemoglobin: 15.5 g/dL (ref 13.0–17.7)
Immature Grans (Abs): 0 10*3/uL (ref 0.0–0.1)
Immature Granulocytes: 0 %
Lymphocytes Absolute: 1.2 10*3/uL (ref 0.7–3.1)
Lymphs: 21 %
MCH: 26.1 pg — ABNORMAL LOW (ref 26.6–33.0)
MCHC: 32.4 g/dL (ref 31.5–35.7)
MCV: 80 fL (ref 79–97)
Monocytes Absolute: 0.4 10*3/uL (ref 0.1–0.9)
Monocytes: 8 %
Neutrophils Absolute: 3.9 10*3/uL (ref 1.4–7.0)
Neutrophils: 68 %
Platelets: 267 10*3/uL (ref 150–450)
RBC: 5.95 x10E6/uL — ABNORMAL HIGH (ref 4.14–5.80)
RDW: 14.5 % (ref 11.6–15.4)
WBC: 5.6 10*3/uL (ref 3.4–10.8)

## 2022-03-01 LAB — COMPREHENSIVE METABOLIC PANEL
ALT: 21 IU/L (ref 0–44)
AST: 20 IU/L (ref 0–40)
Albumin/Globulin Ratio: 1.7 (ref 1.2–2.2)
Albumin: 4.4 g/dL (ref 3.9–4.9)
Alkaline Phosphatase: 110 IU/L (ref 44–121)
BUN/Creatinine Ratio: 12 (ref 10–24)
BUN: 13 mg/dL (ref 8–27)
Bilirubin Total: 0.3 mg/dL (ref 0.0–1.2)
CO2: 21 mmol/L (ref 20–29)
Calcium: 9.5 mg/dL (ref 8.6–10.2)
Chloride: 103 mmol/L (ref 96–106)
Creatinine, Ser: 1.12 mg/dL (ref 0.76–1.27)
Globulin, Total: 2.6 g/dL (ref 1.5–4.5)
Glucose: 95 mg/dL (ref 70–99)
Potassium: 4.7 mmol/L (ref 3.5–5.2)
Sodium: 139 mmol/L (ref 134–144)
Total Protein: 7 g/dL (ref 6.0–8.5)
eGFR: 74 mL/min/{1.73_m2} (ref >59)

## 2022-03-01 LAB — PSA: Prostate Specific Ag, Serum: 1.1 ng/mL (ref 0.0–4.0)

## 2022-03-01 LAB — LIPID PANEL W/O CHOL/HDL RATIO
Cholesterol, Total: 154 mg/dL (ref 100–199)
HDL: 47 mg/dL (ref >39)
LDL Chol Calc (NIH): 94 mg/dL (ref 0–99)
Triglycerides: 66 mg/dL (ref 0–149)
VLDL Cholesterol Cal: 13 mg/dL (ref 5–40)

## 2022-03-01 LAB — TSH: TSH: 1.75 u[IU]/mL (ref 0.450–4.500)

## 2022-03-01 NOTE — Progress Notes (Signed)
Good morning, please let Jonathan Mayer know his labs have returned and overall are stable.  No changes needed.  Prostate lab normal, as are kidney and liver function.  Great news!! Keep being amazing!!  Thank you for allowing me to participate in your care.  I appreciate you. Kindest regards, Zacari Radick

## 2022-03-03 ENCOUNTER — Encounter: Payer: Self-pay | Admitting: Surgery

## 2022-03-03 ENCOUNTER — Ambulatory Visit (INDEPENDENT_AMBULATORY_CARE_PROVIDER_SITE_OTHER): Payer: 59 | Admitting: Surgery

## 2022-03-03 DIAGNOSIS — R1310 Dysphagia, unspecified: Secondary | ICD-10-CM

## 2022-03-03 DIAGNOSIS — K219 Gastro-esophageal reflux disease without esophagitis: Secondary | ICD-10-CM

## 2022-03-03 NOTE — Patient Instructions (Addendum)
Your CT is scheduled for 03/13/2022 at  9 am (arrive by 8:30 am) @ [facility].Nothing to eat or drink 4 hours prior.    Your Barium Swallow is scheduled for  03/13/2022 at 10 am @ [facility]. Nothing to eat or drink 4 hours prior.    If you have any concerns or questions, please feel free to call our office.   Hernia, Adult     A hernia happens when an organ or tissue inside your body pushes out through a weak spot in the muscles of your belly (abdomen). This makes a bulge. The bulge may be: In a scar from a surgery that was done in your belly (incisional hernia). Near your belly button (umbilical hernia). In your groin (inguinal hernia). Your groin is the area where your leg meets your lower belly. If you are a male, this type could also be in your scrotum. In your upper thigh (femoral hernia). Inside your belly (hiatal hernia). This happens when your stomach slides above the muscle between your belly and your chest (diaphragm). What are the causes? This condition may be caused by: Lifting heavy things. Coughing over a long period of time. Having trouble pooping (constipation). Trouble pooping can lead to straining. A cut from surgery in your belly. A physical problem that is present at birth. Being very overweight. Smoking. Too much fluid in your belly. A testicle that has not moved down into the scrotum, in males. What are the signs or symptoms? The main symptom is a bulge in the area of the hernia, but a bulge may not always be seen. It may grow bigger or be easier to see when you cough or strain (such as when lifting something heavy). A hernia that can be pushed back into the belly rarely causes pain. A hernia that cannot be pushed back into the belly may lose its blood supply. This may cause: Pain. Fever. A feeling like you may vomit, and vomiting. Swelling. Trouble pooping. How is this treated? A hernia that is small and painless may not need to be treated.  A hernia that is large or painful may be treated with surgery. Surgery to treat a hernia involves pushing the bulge back into place and repairing the weak area of the muscle or belly. Follow these instructions at home: Activity Avoid straining the muscles near your hernia. This can happen when you: Lift something heavy. Poop (have a bowel movement). Do not lift anything that is heavier than 10 lb (4.5 kg), or the limit that you are told. When you lift something heavy, use your leg muscles. Do not use your back muscles to lift. Prevent trouble pooping If told by your doctor, take steps to prevent trouble pooping. You may need to: Drink enough fluid to keep your pee (urine) pale yellow. Take medicines. You will be told what medicines to take. Eat foods that are high in fiber. These include beans, whole grains, and fresh fruits and vegetables. Limit foods that are high in fat and sugar. These include fried or sweet foods. General instructions When you cough, try to cough gently. You may try to push your hernia back in by gently pressing on it when you are lying down. Do not try to force the bulge back in if it will not go in easily. If you are overweight, work with your doctor to lose weight safely. Do not smoke or use any products that contain nicotine or tobacco. If you need help quitting, ask  your doctor. If you will be having surgery, watch your hernia for changes in shape, size, or color. Tell your doctor if you see any changes. Take over-the-counter and prescription medicines only as told by your doctor. Keep all follow-up visits. Contact a doctor if: You get new pain, swelling, or redness near your hernia. You poop fewer times in a week than normal. You have trouble pooping. You have poop that is more dry than normal. You have poop that is harder or larger than normal. Get help right away if: You have a fever or chills. You have belly pain that gets worse. You feel like you may  vomit, or you vomit. Your hernia cannot be pushed in by gently pressing on it when you are lying down. Your hernia: Changes in shape or size. Changes color. Feels hard, or it hurts when you touch it. These symptoms may be an emergency. Get help right away. Call your local emergency services (911 in the [place] Do not wait to see if the symptoms will go away. Do not drive yourself to the hospital. Summary A hernia happens when an organ or tissue inside your body pushes out through a weak spot in the belly muscles. This creates a bulge. If your hernia is small and it does not hurt, you may not need treatment. If your hernia is large or it hurts, you may need surgery. If you will be having surgery, watch your hernia for changes in shape, size, or color. Tell your doctor about any changes. This information is not intended to replace advice given to you by your health care provider. Make sure you discuss any questions you have with your health care provider. Document Revised: 10/17/2019 Document Reviewed: 10/17/2019 Elsevier Patient Education  [place].

## 2022-03-03 NOTE — Progress Notes (Signed)
Patient ID: Jonathan Mayer, male   DOB: 06-02-1960, 61 y.o.   MRN: [id number]  HPI Jonathan Mayer is a 61 y.o. male in consultation at the request of Dr. Allen Norris.  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  He reports that he has been having reflux for up to 20 years or so.  Over the last 6 months symptoms have exacerbated.  He experiences reflux despite trying multiple PPIs to include pantoprazole, Dexilant and omeprazole.  He developed a rash to Dexilant He reports that there are certain meals that exacerbate his reflux and symptoms worsen when he lays on his his back.  He denies dysphagia.  He has purposely lost about 20 pounds or so. He has had some voice changes and hoarseness due to reflux. He also reports ocassional emesis He has had multiple surgical issues to include knee procedures, bilateral inguinal hernias in his early 31s. The reason under went endoscopic evaluation and I have personally reviewed the EGD showing evidence of a hiatal hernia.. He seems to be miserable due to reflux symptoms. He is able to perform more than 4 METS of activity without any shortness of breath or chest pain. He is retired and used to work for [employer] Also had a prior history of hemorrhoid surgery BC and CMP was completely normal. HPI  Past Medical History:  Diagnosis Date   Arthritis    Family history of adverse reaction to anesthesia    MOM-BP DROPPED   GERD (gastroesophageal reflux disease)    Headache    MIGRAINES   Pneumonia    X2 AGE 87 AND IN HIS 30'S    Past Surgical History:  Procedure Laterality Date   CARPOMETACARPAL ([place]) FUSION OF THUMB Left 10/10/2021   Procedure: CARPOMETACARPAL ([place]) FUSION OF THUMB;  Surgeon: Earnestine Leys, MD;  Location: [facility];  Service: Orthopedics;  Laterality: Left;   COLONOSCOPY  2015   COLONOSCOPY WITH PROPOFOL N/A 02/03/2022   Procedure: COLONOSCOPY WITH PROPOFOL;  Surgeon: Lucilla Lame, MD;  Location: [place];   Service: Endoscopy;  Laterality: N/A;   ESOPHAGOGASTRODUODENOSCOPY     ESOPHAGOGASTRODUODENOSCOPY (EGD) WITH PROPOFOL N/A 02/03/2022   Procedure: ESOPHAGOGASTRODUODENOSCOPY (EGD) WITH PROPOFOL;  Surgeon: Lucilla Lame, MD;  Location: [place];  Service: Endoscopy;  Laterality: N/A;   HEMORRHOID SURGERY N/A 05/25/2018   Procedure: HEMORRHOIDECTOMY;  Surgeon: [name], MD;  Location: [facility];  Service: General;  Laterality: N/A;   HERNIA REPAIR Bilateral    20'S   KNEE ARTHROSCOPY W/ MENISCAL REPAIR Right    POLYPECTOMY N/A 02/03/2022   Procedure: POLYPECTOMY;  Surgeon: Lucilla Lame, MD;  Location: [place];  Service: Endoscopy;  Laterality: N/A;   TONSILLECTOMY AND ADENOIDECTOMY     TRIGGER FINGER RELEASE     x3    Family History  Problem Relation Age of Onset   Osteoporosis Mother    GER disease Father    Cancer Paternal Uncle    Hypertension Maternal Grandfather     Social History Social History   Tobacco Use   Smoking status: Never   Smokeless tobacco: Never  Vaping Use   Vaping Use: Never used  Substance Use Topics   Alcohol use: No   Drug use: No    Allergies  Allergen Reactions   Aspirin Itching and Other (See Comments)    Eyes swell   Ibuprofen Itching and Other (See Comments)    Eyes swell   Terbinafine And Related Hives  Happened with sun exposure, resolved with Benadryl    Current Outpatient Medications  Medication Sig Dispense Refill   fluticasone (FLONASE) 50 MCG/ACT nasal spray Place 1 spray into both nostrils every morning.      No current facility-administered medications for this visit.     Review of Systems Full ROS  was asked and was negative except for the information on the HPI  Physical Exam  CONSTITUTIONAL: NAD. EYES: Pupils are equal, round, and Sclera are non-icteric. EARS, NOSE, MOUTH AND THROAT: The oropharynx is clear. The oral mucosa is pink and moist. Hearing is intact to voice. LYMPH NODES:   Lymph nodes in the neck are normal. RESPIRATORY:  Lungs are clear. There is normal respiratory effort, with equal breath sounds bilaterally, and without pathologic use of accessory muscles. CARDIOVASCULAR: Heart is regular without murmurs, gallops, or rubs. GI: The abdomen is  soft, nontender, and nondistended. There are no palpable masses. There is no hepatosplenomegaly. There are normal bowel sounds in all quadrants. GU: Rectal deferred.   MUSCULOSKELETAL: Normal muscle strength and tone. No cyanosis or edema.   SKIN: Turgor is good and there are no pathologic skin lesions or ulcers. NEUROLOGIC: Motor and sensation is grossly normal. Cranial nerves are grossly intact. PSYCH:  Oriented to person, place and time. Affect is normal.  Data Reviewed  I have personally reviewed the patient's imaging, laboratory findings and medical records.    Assessment/Plan 61 year old male with evidence of recalcitrant reflux.  He is very interested in discontinue PPI and look at alternatives such as antireflux surgery.  I do think this will be reasonable as long as he continues to lose weight.  We will go ahead and proceed with further workup with a barium swallow and a CT scan of the abdomen and pelvis.  I had an extensive discussion with him regarding his disease process and surgical options.  We also discussed weight optimization and he did agree. Also talked about what they robotic hiatal hernia will detail, dietary restrictions as well as postoperative course. I spent 55 minutes in this encounter including personally reviewing imaging studies, medical records, coordinating  his care, counseling the patient,placing orders and performing appropriate documentation. A copy of this report was sent to the referring provider.   Caroleen Hamman, MD FACS General Surgeon 03/03/2022, 1:18 PM

## 2022-03-13 ENCOUNTER — Telehealth: Payer: Self-pay

## 2022-03-13 ENCOUNTER — Ambulatory Visit
Admission: RE | Admit: 2022-03-13 | Discharge: 2022-03-13 | Disposition: A | Discharge status: Home / Self Care | Payer: 59 | Source: Ambulatory Visit | Attending: Surgery | Admitting: Surgery

## 2022-03-13 DIAGNOSIS — K219 Gastro-esophageal reflux disease without esophagitis: Secondary | ICD-10-CM | POA: Insufficient documentation

## 2022-03-13 DIAGNOSIS — R1310 Dysphagia, unspecified: Secondary | ICD-10-CM

## 2022-03-13 MED ORDER — IOHEXOL 300 MG/ML  SOLN
100.0000 mL | Freq: Once | INTRAMUSCULAR | Status: AC | PRN
  Administered 2022-03-13: 100 mL via INTRAVENOUS

## 2022-03-13 NOTE — Telephone Encounter (Signed)
Patient notified of CT results. Keep follow up appointment.

## 2022-03-26 ENCOUNTER — Encounter: Payer: Self-pay | Admitting: Surgery

## 2022-03-26 ENCOUNTER — Ambulatory Visit (INDEPENDENT_AMBULATORY_CARE_PROVIDER_SITE_OTHER): Payer: 59 | Admitting: Surgery

## 2022-03-26 VITALS — BP 145/101 | HR 67 | Temp 97.8°F | Ht 67.0 in | Wt 214.2 lb

## 2022-03-26 DIAGNOSIS — K219 Gastro-esophageal reflux disease without esophagitis: Secondary | ICD-10-CM

## 2022-03-26 DIAGNOSIS — K449 Diaphragmatic hernia without obstruction or gangrene: Secondary | ICD-10-CM | POA: Diagnosis not present

## 2022-03-26 NOTE — Patient Instructions (Signed)
Our surgery scheduler Pamala Hurry will call you within 24-48 hours to get you scheduled. If you have not heard from her after 48 hours, please call our office. Have the blue sheet available when she calls to write down important information.   If you have any concerns or questions, please feel free to call our office.   Laparoscopic Nissen Fundoplication  Laparoscopic Nissen fundoplication is a surgery to relieve heartburn and other problems caused by fluid from your stomach (gastric fluids) flowing up into your esophagus. The esophagus is the part of the body that moves food from the mouth to the stomach. Normally, the muscle that sits between the stomach and the esophagus (lower esophageal sphincter, LES) keeps stomach fluids in the stomach. In some people, the LES does not work properly, and stomach fluids flow up into the esophagus (reflux). This can happen when part of the stomach bulges through the LES (hiatal hernia). The backward flow of stomach fluids can cause a type of severe and long-lasting heartburn that is called gastroesophageal reflux disease (GERD). You may need this surgery if other treatments for GERD have not helped. In this procedure, the upper part of your stomach is wrapped around the lower end of your esophagus and stitched together (sutured). This tightens the connection between your esophagus and stomach to prevent stomach acid reflux. Tell a health care provider about: Any allergies you have. All medicines you are taking, including vitamins, herbs, eye drops, creams, and over-the-counter medicines. Any problems you or family members have had with anesthetic medicines. Any blood disorders you have. Any surgeries you have had. Any medical conditions you have. Whether you are pregnant or may be pregnant. What are the risks? Generally, this is a safe procedure. However, problems may occur, including: Infection. Bleeding. Damage to other structures or organs. This can include  damage to the lung, causing a collapsed lung. Trouble swallowing (dysphagia). Blood clots. Allergic reactions to medicines. What happens before the procedure? Staying hydrated Follow instructions from your health care provider about hydration, which may include: Up to two hours before the procedure - you may continue to drink clear liquids, such as water, clear fruit juice, black coffee and plain tea. Eating and drinking restrictions Follow instructions from your health care provider about eating and drinking, which may include: 8 hours before the procedure - stop eating heavy meals or foods, such as meat, fried foods, or fatty foods. 6 hours before the procedure - stop eating light meals or foods, such as toast or cereal. 6 hours before the procedure - stop drinking milk or drinks that contain milk. 2 hours before the procedure - stop drinking clear liquids. Medicines Ask your health care provider about: Changing or stopping your regular medicines. This is especially important if you are taking diabetes medicines or blood thinners. Taking medicines such as aspirin and ibuprofen. These medicines can thin your blood. Do not take these medicines unless your health care provider tells you to take them. Taking over-the-counter medicines, vitamins, herbs, and supplements. Tests Your health care provider will do tests to plan the procedure. This may include: An exam using a flexible scope passed down your esophagus into your stomach (endoscopy). Imaging studies. General instructions Plan to have a responsible adult take you home from the hospital or clinic. Ask your health care provider: How your surgery site will be marked. What steps will be taken to help prevent infection. These steps may include: Removing hair at the surgery site. Washing skin with a germ-killing soap.  Taking antibiotic medicine. Do not use any products that contain nicotine or tobacco for at least 4 weeks before the  procedure. These products include cigarettes, chewing tobacco, and vaping devices, such as e-cigarettes. If you need help quitting, ask your health care provider. What happens during the procedure? An IV will be inserted into one of your veins. You will be given medicine in your IV to help you relax (sedative) just before the procedure and a medicine to make you fall asleep (general anesthetic). You may have a tube placed through your nose into your stomach to drain stomach acid during the procedure (nasogastric tube). The surgeon will make a small incision in your abdomen and insert a tube through the incision. Your abdomen will be filled with a gas. This helps the surgeon see your organs better, and it makes more space to work. The surgeon will insert a thin, lighted tube (laparoscope) through the small incision. This allows your surgeon to see into your abdomen. The surgeon will make several other small incisions in your abdomen to insert the other instruments that are needed during the procedure. Another instrument (dilator) will be passed through your mouth and down your esophagus into the upper part of your stomach. The dilator will prevent your LES from being closed too tightly during surgery. The upper part of your stomach will be wrapped around the lower part of your esophagus and will be stitched into place. This will strengthen the lower esophageal sphincter and prevent reflux. If you have a hiatal hernia, it will be repaired. The gas will be released from your abdomen. All instruments will be removed, and the incisions will be closed with stitches (sutures). A bandage (dressing) will be placed on your skin over the incisions. The procedure may vary among health care providers and hospitals. What happens after the procedure? Your blood pressure, heart rate, breathing rate, and blood oxygen level will be monitored until you leave the hospital or clinic. You will be given pain medicine as  needed. Your IV will be kept in until you are able to drink fluids. You will be encouraged to get up and walk around as soon as possible. Summary Laparoscopic Nissen fundoplication is a surgery to relieve heartburn and other problems caused by gastric fluids flowing up into your esophagus. You may need this surgery if other treatments for GERD have not helped. Follow instructions from your health care provider about eating and drinking before the procedure. Your surgeon will use a thin, lighted tube (laparoscope) that is inserted through a small incision, allowing the surgeon to see into your abdomen. This information is not intended to replace advice given to you by your health care provider. Make sure you discuss any questions you have with your health care provider. Document Revised: 09/25/2019 Document Reviewed: 09/25/2019 Elsevier Patient Education  [place].

## 2022-03-27 ENCOUNTER — Telehealth: Payer: Self-pay | Admitting: Surgery

## 2022-03-27 NOTE — Telephone Encounter (Signed)
Patient has been advised of Pre-Admission date/time, and Surgery date at [facility].  Surgery Date: 04/17/22 Preadmission Testing Date: 04/08/22 (phone 8a-1p)  Patient has been made aware to call [phone], between 1-3:00pm the day before surgery, to find out what time to arrive for surgery.

## 2022-03-27 NOTE — H&P (View-Only) (Signed)
Outpatient Surgical Follow Up  03/27/2022  Jonathan Mayer is an 62 y.o. male.   Chief Complaint  Patient presents with   Follow-up    Hiatal hernia    HPI: Jonathan Mayer is a 62 y.o. male seen in follow-up for recalcitrant reflux l.  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  He reports that he has been having reflux for up to 20 years or so.  Over the last 6 months symptoms have exacerbated.  He experiences reflux despite trying multiple PPIs to include pantoprazole, Dexilant and omeprazole.  He developed a rash to Dexilant He reports that there are certain meals that exacerbate his reflux and symptoms worsen when he lays on his his back.  He denies dysphagia.  He has purposely lost about 20 pounds or so. He has had some voice changes and hoarseness due to reflux. He also reports ocassional emesis He has had multiple surgical issues to include knee procedures, bilateral inguinal hernias in his early 18s. The reason under went endoscopic evaluation and I have personally reviewed the EGD showing evidence of a hiatal hernia. He also had a barium swallow showing a small hiatal hernia but no intraluminal lesions.  No strictures.  CT scan also personally reviewed showing a small hiatal hernia and cholelithiasis.  Please note that I have interrogated the chance of symptomatic cholelithiasis and he has no symptoms associated with it  He seems to be miserable due to reflux symptoms. He is able to perform more than 4 METS of activity without any shortness of breath or chest pain. He is retired and used to work for [employer] Also had a prior history of hemorrhoid surgery BC and CMP was completely normal.  Past Medical History:  Diagnosis Date   Arthritis    Family history of adverse reaction to anesthesia    MOM-BP DROPPED   GERD (gastroesophageal reflux disease)    Headache    MIGRAINES   Pneumonia    X2 AGE 80 AND IN HIS 30'S    Past Surgical History:  Procedure  Laterality Date   CARPOMETACARPAL ([place]) FUSION OF THUMB Left 10/10/2021   Procedure: CARPOMETACARPAL ([place]) FUSION OF THUMB;  Surgeon: Earnestine Leys, MD;  Location: [facility];  Service: Orthopedics;  Laterality: Left;   COLONOSCOPY  2015   COLONOSCOPY WITH PROPOFOL N/A 02/03/2022   Procedure: COLONOSCOPY WITH PROPOFOL;  Surgeon: Lucilla Lame, MD;  Location: [place];  Service: Endoscopy;  Laterality: N/A;   ESOPHAGOGASTRODUODENOSCOPY     ESOPHAGOGASTRODUODENOSCOPY (EGD) WITH PROPOFOL N/A 02/03/2022   Procedure: ESOPHAGOGASTRODUODENOSCOPY (EGD) WITH PROPOFOL;  Surgeon: Lucilla Lame, MD;  Location: [place];  Service: Endoscopy;  Laterality: N/A;   HEMORRHOID SURGERY N/A 05/25/2018   Procedure: HEMORRHOIDECTOMY;  Surgeon: [name], MD;  Location: [facility];  Service: General;  Laterality: N/A;   HERNIA REPAIR Bilateral    20'S   KNEE ARTHROSCOPY W/ MENISCAL REPAIR Right    POLYPECTOMY N/A 02/03/2022   Procedure: POLYPECTOMY;  Surgeon: Lucilla Lame, MD;  Location: [place];  Service: Endoscopy;  Laterality: N/A;   TONSILLECTOMY AND ADENOIDECTOMY     TRIGGER FINGER RELEASE     x3    Family History  Problem Relation Age of Onset   Osteoporosis Mother    GER disease Father    Cancer Paternal Uncle    Hypertension Maternal Grandfather     Social History:  reports that he has never smoked. He has never used smokeless tobacco.  He reports that he does not drink alcohol and does not use drugs.  Allergies:  Allergies  Allergen Reactions   Aspirin Itching and Other (See Comments)    Eyes swell   Ibuprofen Itching and Other (See Comments)    Eyes swell   Terbinafine And Related Hives    Happened with sun exposure, resolved with Benadryl    Medications reviewed.    ROS Full ROS performed and is otherwise negative other than what is stated in HPI   BP (!) 145/101   Pulse 67   Temp 97.8 F (36.6 C) (Oral)   Ht '5\' 7"'$  (1.702 m)   Wt 214 lb 3.2  oz (97.2 kg)   SpO2 98%   BMI 33.55 kg/m   Physical Exam  CONSTITUTIONAL: NAD. EYES: Pupils are equal, round, and Sclera are non-icteric. EARS, NOSE, MOUTH AND THROAT: The oropharynx is clear. The oral mucosa is pink and moist. Hearing is intact to voice. LYMPH NODES:  Lymph nodes in the neck are normal. RESPIRATORY:  Lungs are clear. There is normal respiratory effort, with equal breath sounds bilaterally, and without pathologic use of accessory muscles. CARDIOVASCULAR: Heart is regular without murmurs, gallops, or rubs. GI: The abdomen is  soft, nontender, and nondistended. There are no palpable masses. There is no hepatosplenomegaly. There are normal bowel sounds in all quadrants. GU: Rectal deferred.   MUSCULOSKELETAL: Normal muscle strength and tone. No cyanosis or edema.   SKIN: Turgor is good and there are no pathologic skin lesions or ulcers. NEUROLOGIC: Motor and sensation is grossly normal. Cranial nerves are grossly intact. PSYCH:  Oriented to person, place and time. Affect is normal.     Assessment/Plan: 62 yo with evidence of recalcitrant reflux and a sliding hiatal hernia. He is very interested in discontinuing PPI and antireflux surgery.  He does have a BMI of 33.5 but also has lost about 30 pounds in the last 6 months.  He did lose 4-1/2 pounds since my last visit.  At this point in time he continues to have significant quality of life issues due to his reflux disease.  I had an extensive discussion about worse outcomes with higher BMI I do think that in this case however the benefits of proceeding with surgical invention I would wait the potential risk.  He fully understands that he may be a bit higher chances of recurrence of symptoms.  He will continue to diet and exercise and hopefully we will schedule him in the next few weeks.  I have discussed with the patient in detail about his disease process.  I also talked about surgical intervention for reflux and what it  entails.  Operative outcomes and postoperative course including diet modification.  Extensive counseling was provided.  He seems to be very interested in pursuing further workup and surgical intervention to correct his symptomatic hiatal hernia and GERD. Procedure d/w the pt in detail. Risks, benefits and possible complications including but not limited to: Bleeding, infection, pneumothorax, bowel or esophageal injuries, chronic bloating, chronic pain recurrence.   I spent 40 minutes in this encounter including personally reviewing imaging studies, coordinating her care, placing orders, counseling the patient and performing appropriate documentation  Caroleen Hamman, MD [place] Surgeon

## 2022-03-27 NOTE — Progress Notes (Signed)
Outpatient Surgical Follow Up  03/27/2022  Jonathan Mayer is an 62 y.o. male.   Chief Complaint  Patient presents with   Follow-up    Hiatal hernia    HPI: Jonathan Mayer is a 62 y.o. male seen in follow-up for recalcitrant reflux l.  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  He reports that he has been having reflux for up to 20 years or so.  Over the last 6 months symptoms have exacerbated.  He experiences reflux despite trying multiple PPIs to include pantoprazole, Dexilant and omeprazole.  He developed a rash to Dexilant He reports that there are certain meals that exacerbate his reflux and symptoms worsen when he lays on his his back.  He denies dysphagia.  He has purposely lost about 20 pounds or so. He has had some voice changes and hoarseness due to reflux. He also reports ocassional emesis He has had multiple surgical issues to include knee procedures, bilateral inguinal hernias in his early 41s. The reason under went endoscopic evaluation and I have personally reviewed the EGD showing evidence of a hiatal hernia. He also had a barium swallow showing a small hiatal hernia but no intraluminal lesions.  No strictures.  CT scan also personally reviewed showing a small hiatal hernia and cholelithiasis.  Please note that I have interrogated the chance of symptomatic cholelithiasis and he has no symptoms associated with it  He seems to be miserable due to reflux symptoms. He is able to perform more than 4 METS of activity without any shortness of breath or chest pain. He is retired and used to work for [employer] Also had a prior history of hemorrhoid surgery BC and CMP was completely normal.  Past Medical History:  Diagnosis Date   Arthritis    Family history of adverse reaction to anesthesia    MOM-BP DROPPED   GERD (gastroesophageal reflux disease)    Headache    MIGRAINES   Pneumonia    X2 AGE 40 AND IN HIS 30'S    Past Surgical History:  Procedure  Laterality Date   CARPOMETACARPAL ([place]) FUSION OF THUMB Left 10/10/2021   Procedure: CARPOMETACARPAL ([place]) FUSION OF THUMB;  Surgeon: Earnestine Leys, MD;  Location: [facility];  Service: Orthopedics;  Laterality: Left;   COLONOSCOPY  2015   COLONOSCOPY WITH PROPOFOL N/A 02/03/2022   Procedure: COLONOSCOPY WITH PROPOFOL;  Surgeon: Lucilla Lame, MD;  Location: [place];  Service: Endoscopy;  Laterality: N/A;   ESOPHAGOGASTRODUODENOSCOPY     ESOPHAGOGASTRODUODENOSCOPY (EGD) WITH PROPOFOL N/A 02/03/2022   Procedure: ESOPHAGOGASTRODUODENOSCOPY (EGD) WITH PROPOFOL;  Surgeon: Lucilla Lame, MD;  Location: [place];  Service: Endoscopy;  Laterality: N/A;   HEMORRHOID SURGERY N/A 05/25/2018   Procedure: HEMORRHOIDECTOMY;  Surgeon: [name], MD;  Location: [facility];  Service: General;  Laterality: N/A;   HERNIA REPAIR Bilateral    20'S   KNEE ARTHROSCOPY W/ MENISCAL REPAIR Right    POLYPECTOMY N/A 02/03/2022   Procedure: POLYPECTOMY;  Surgeon: Lucilla Lame, MD;  Location: [place];  Service: Endoscopy;  Laterality: N/A;   TONSILLECTOMY AND ADENOIDECTOMY     TRIGGER FINGER RELEASE     x3    Family History  Problem Relation Age of Onset   Osteoporosis Mother    GER disease Father    Cancer Paternal Uncle    Hypertension Maternal Grandfather     Social History:  reports that he has never smoked. He has never used smokeless tobacco.  He reports that he does not drink alcohol and does not use drugs.  Allergies:  Allergies  Allergen Reactions   Aspirin Itching and Other (See Comments)    Eyes swell   Ibuprofen Itching and Other (See Comments)    Eyes swell   Terbinafine And Related Hives    Happened with sun exposure, resolved with Benadryl    Medications reviewed.    ROS Full ROS performed and is otherwise negative other than what is stated in HPI   BP (!) 145/101   Pulse 67   Temp 97.8 F (36.6 C) (Oral)   Ht '5\' 7"'$  (1.702 m)   Wt 214 lb 3.2  oz (97.2 kg)   SpO2 98%   BMI 33.55 kg/m   Physical Exam  CONSTITUTIONAL: NAD. EYES: Pupils are equal, round, and Sclera are non-icteric. EARS, NOSE, MOUTH AND THROAT: The oropharynx is clear. The oral mucosa is pink and moist. Hearing is intact to voice. LYMPH NODES:  Lymph nodes in the neck are normal. RESPIRATORY:  Lungs are clear. There is normal respiratory effort, with equal breath sounds bilaterally, and without pathologic use of accessory muscles. CARDIOVASCULAR: Heart is regular without murmurs, gallops, or rubs. GI: The abdomen is  soft, nontender, and nondistended. There are no palpable masses. There is no hepatosplenomegaly. There are normal bowel sounds in all quadrants. GU: Rectal deferred.   MUSCULOSKELETAL: Normal muscle strength and tone. No cyanosis or edema.   SKIN: Turgor is good and there are no pathologic skin lesions or ulcers. NEUROLOGIC: Motor and sensation is grossly normal. Cranial nerves are grossly intact. PSYCH:  Oriented to person, place and time. Affect is normal.     Assessment/Plan: 62 yo with evidence of recalcitrant reflux and a sliding hiatal hernia. He is very interested in discontinuing PPI and antireflux surgery.  He does have a BMI of 33.5 but also has lost about 30 pounds in the last 6 months.  He did lose 4-1/2 pounds since my last visit.  At this point in time he continues to have significant quality of life issues due to his reflux disease.  I had an extensive discussion about worse outcomes with higher BMI I do think that in this case however the benefits of proceeding with surgical invention I would wait the potential risk.  He fully understands that he may be a bit higher chances of recurrence of symptoms.  He will continue to diet and exercise and hopefully we will schedule him in the next few weeks.  I have discussed with the patient in detail about his disease process.  I also talked about surgical intervention for reflux and what it  entails.  Operative outcomes and postoperative course including diet modification.  Extensive counseling was provided.  He seems to be very interested in pursuing further workup and surgical intervention to correct his symptomatic hiatal hernia and GERD. Procedure d/w the pt in detail. Risks, benefits and possible complications including but not limited to: Bleeding, infection, pneumothorax, bowel or esophageal injuries, chronic bloating, chronic pain recurrence.   I spent 40 minutes in this encounter including personally reviewing imaging studies, coordinating her care, placing orders, counseling the patient and performing appropriate documentation  Caroleen Hamman, MD [facility] Surgeon

## 2022-03-27 NOTE — Telephone Encounter (Signed)
Spoke with the patient and instructed that he may not drive while taking narcotic pain medication. Once he is only on over the counter pain medication he will need to make sure he can sit in his car and look over both shoulders comfortably before driving. He is aware of instructions.

## 2022-03-27 NOTE — Telephone Encounter (Signed)
Patient will be having robotic paraesophageal hernia repair on 04/17/22 with Dr. Dahlia Byes.  Patient would like to know how long he would need to wait before driving.  It wouldn't be prolonged driving just a short drive to his mom to make sure she is doing alright.  Please advise. Thanks

## 2022-04-08 ENCOUNTER — Other Ambulatory Visit: Payer: Self-pay

## 2022-04-08 ENCOUNTER — Encounter
Admission: RE | Admit: 2022-04-08 | Discharge: 2022-04-08 | Disposition: A | Discharge status: Home / Self Care | Payer: 59 | Source: Ambulatory Visit | Attending: Surgery | Admitting: Surgery

## 2022-04-08 HISTORY — DX: Personal history of other diseases of the digestive system: Z87.19

## 2022-04-08 NOTE — Pre-Procedure Instructions (Signed)
Emailed instructions to pt.

## 2022-04-08 NOTE — Patient Instructions (Signed)
Your procedure is scheduled on: 04/17/22 Report to [facility]. To find out your arrival time please call [phone] between 1PM - 3PM on 04/16/22.  Remember: Instructions that are not followed completely may result in serious medical risk, up to and including death, or upon the discretion of your surgeon and anesthesiologist your surgery may need to be rescheduled.     _X__ 1. Do not eat food after midnight the night before your procedure.                 No gum chewing or hard candies. You may drink clear liquids up to 2 hours                 before you are scheduled to arrive for your surgery- DO not drink clear                 liquids within 2 hours of the start of your surgery.                 Clear Liquids include:  water, apple juice without pulp, clear carbohydrate                 drink such as Clearfast or Gatorade, Black Coffee or Tea (Do not add                 anything to coffee or tea). Diabetics water only  __X__2.  On the morning of surgery brush your teeth with toothpaste and water, you                 may rinse your mouth with mouthwash if you wish.  Do not swallow any              toothpaste of mouthwash.     _X__ 3.  No Alcohol for 24 hours before or after surgery.   _X__ 4.  Do Not Smoke or use e-cigarettes For 24 Hours Prior to Your Surgery.                 Do not use any chewable tobacco products for at least 6 hours prior to                 surgery.  ____  5.  Bring all medications with you on the day of surgery if instructed.   __X__  6.  Notify your doctor if there is any change in your medical condition      (cold, fever, infections).     Do not wear jewelry, make-up, hairpins, clips or nail polish. Do not wear lotions, powders, or perfumes.  Do not shave 48 hours prior to surgery. Men may shave face and neck. Do not bring valuables to the hospital.    [facility] is not responsible for any belongings or  valuables.  Contacts, dentures/partials or body piercings may not be worn into surgery. Bring a case for your contacts, glasses or hearing aids, a denture cup will be supplied. Leave your suitcase in the car. After surgery it may be brought to your room. For patients admitted to the hospital, discharge time is determined by your treatment team.   Patients discharged the day of surgery will need an adult driver. You will not be allowed to drive yourself home, take an uber, lyft or taxi. You may use medical transportation such as [place] as long as you have an adult with you or  waiting to assist you upon your arrival home. You must have an adult over 30 years old in the home with you for the first 24 hours after surgery    __X__ Take these medicines the morning of surgery with A SIP OF WATER:    1. esomeprazole (NEXIUM) 20 MG capsule   2. May use Flonase  3.   4.  5.  6.  ____ Fleet Enema (as directed)   __X__ Use CHG Soap/SAGE wipes as directed  You may use Dial "gold" bar for several days before and the day of surgery as discussed.  ____ Use inhalers on the day of surgery  ____ Stop metformin/Janumet/Farxiga 2 days prior to surgery    ____ Take 1/2 of usual insulin dose the night before surgery. No insulin the morning          of surgery.   ____ Stop Blood Thinners Coumadin/Plavix/Xarelto/Pleta/Pradaxa/Eliquis/Effient/Aspirin  on   Or contact your Surgeon, Cardiologist or Medical Doctor regarding  ability to stop your blood thinners  __X__ Stop Anti-inflammatories 7 days before surgery such as Advil, Ibuprofen, Motrin,  BC or Goodies Powder, Naprosyn, Naproxen, Aleve, Aspirin     You can take Tylenol if needed   __X__ Stop all herbals and supplements, fish oil or vitamins  until after surgery.    ____ Bring C-Pap to the hospital.

## 2022-04-09 ENCOUNTER — Other Ambulatory Visit: Payer: 59

## 2022-04-17 ENCOUNTER — Encounter
Admission: RE | Disposition: A | Discharge status: Home / Self Care | Payer: Self-pay | Source: Home / Self Care | Attending: Surgery

## 2022-04-17 ENCOUNTER — Other Ambulatory Visit: Payer: Self-pay

## 2022-04-17 ENCOUNTER — Ambulatory Visit: Payer: 59 | Admitting: Anesthesiology

## 2022-04-17 ENCOUNTER — Encounter: Payer: Self-pay | Admitting: Surgery

## 2022-04-17 ENCOUNTER — Observation Stay
Admission: RE | Admit: 2022-04-17 | Discharge: 2022-04-18 | Disposition: A | Discharge status: Home / Self Care | Payer: 59 | Attending: Surgery | Admitting: Surgery

## 2022-04-17 DIAGNOSIS — K449 Diaphragmatic hernia without obstruction or gangrene: Principal | ICD-10-CM

## 2022-04-17 DIAGNOSIS — K219 Gastro-esophageal reflux disease without esophagitis: Secondary | ICD-10-CM

## 2022-04-17 DIAGNOSIS — Z8719 Personal history of other diseases of the digestive system: Secondary | ICD-10-CM

## 2022-04-17 HISTORY — PX: INSERTION OF MESH: SHX5868

## 2022-04-17 HISTORY — PX: XI ROBOTIC ASSISTED PARAESOPHAGEAL HERNIA REPAIR: SHX6871

## 2022-04-17 LAB — CBC
HCT: 46.9 % (ref 39.0–52.0)
Hemoglobin: 15 g/dL (ref 13.0–17.0)
MCH: 25.9 pg — ABNORMAL LOW (ref 26.0–34.0)
MCHC: 32 g/dL (ref 30.0–36.0)
MCV: 80.9 fL (ref 80.0–100.0)
Platelets: 233 10*3/uL (ref 150–400)
RBC: 5.8 MIL/uL (ref 4.22–5.81)
RDW: 15.2 % (ref 11.5–15.5)
WBC: 14.1 10*3/uL — ABNORMAL HIGH (ref 4.0–10.5)
nRBC: 0 % (ref 0.0–0.2)

## 2022-04-17 LAB — CREATININE, SERUM
Creatinine, Ser: 1.09 mg/dL (ref 0.61–1.24)
GFR, Estimated: >60 mL/min (ref >60)

## 2022-04-17 LAB — HIV ANTIBODY (ROUTINE TESTING W REFLEX): HIV Screen 4th Generation wRfx: NONREACTIVE

## 2022-04-17 SURGERY — REPAIR, HERNIA, PARAESOPHAGEAL, ROBOT-ASSISTED
Anesthesia: General

## 2022-04-17 MED ORDER — BUPIVACAINE LIPOSOME 1.3 % IJ SUSP
INTRAMUSCULAR | Status: AC
  Filled 2022-04-17: qty 20

## 2022-04-17 MED ORDER — EPHEDRINE SULFATE (PRESSORS) 50 MG/ML IJ SOLN
INTRAMUSCULAR | Status: DC | PRN
  Administered 2022-04-17: 10 mg via INTRAVENOUS
  Administered 2022-04-17: 5 mg via INTRAVENOUS
  Administered 2022-04-17: 10 mg via INTRAVENOUS
  Administered 2022-04-17: 5 mg via INTRAVENOUS

## 2022-04-17 MED ORDER — MORPHINE SULFATE (PF) 2 MG/ML IV SOLN: 2.0000 mg | INTRAVENOUS | Status: DC | PRN

## 2022-04-17 MED ORDER — LIDOCAINE HCL (PF) 2 % IJ SOLN
INTRAMUSCULAR | Status: AC
  Filled 2022-04-17: qty 5

## 2022-04-17 MED ORDER — BUPIVACAINE HCL (PF) 0.25 % IJ SOLN
INTRAMUSCULAR | Status: AC
  Filled 2022-04-17: qty 30

## 2022-04-17 MED ORDER — ENOXAPARIN SODIUM 40 MG/0.4ML IJ SOSY: 40.0000 mg | PREFILLED_SYRINGE | INTRAMUSCULAR | Status: DC

## 2022-04-17 MED ORDER — PHENYLEPHRINE HCL-NACL 20-0.9 MG/250ML-% IV SOLN
INTRAVENOUS | Status: DC | PRN
  Administered 2022-04-17: 28 ug/min via INTRAVENOUS

## 2022-04-17 MED ORDER — CEFAZOLIN SODIUM-DEXTROSE 2-4 GM/100ML-% IV SOLN
2.0000 g | INTRAVENOUS | Status: AC
  Administered 2022-04-17: 2 g via INTRAVENOUS

## 2022-04-17 MED ORDER — CHLORHEXIDINE GLUCONATE CLOTH 2 % EX PADS
6.0000 | MEDICATED_PAD | Freq: Once | CUTANEOUS | Status: AC
  Administered 2022-04-17: 6 via TOPICAL

## 2022-04-17 MED ORDER — DIPHENHYDRAMINE HCL 12.5 MG/5ML PO ELIX: 12.5000 mg | ORAL_SOLUTION | Freq: Four times a day (QID) | ORAL | Status: DC | PRN

## 2022-04-17 MED ORDER — PREGABALIN 50 MG PO CAPS
100.0000 mg | ORAL_CAPSULE | Freq: Three times a day (TID) | ORAL | Status: DC
  Administered 2022-04-17 – 2022-04-18 (×3): 100 mg via ORAL
  Filled 2022-04-17 (×4): qty 2

## 2022-04-17 MED ORDER — ONDANSETRON HCL 4 MG/2ML IJ SOLN: 4.0000 mg | Freq: Four times a day (QID) | INTRAMUSCULAR | Status: DC | PRN

## 2022-04-17 MED ORDER — SUGAMMADEX SODIUM 200 MG/2ML IV SOLN
INTRAVENOUS | Status: DC | PRN
  Administered 2022-04-17: 200 mg via INTRAVENOUS

## 2022-04-17 MED ORDER — ONDANSETRON HCL 4 MG/2ML IJ SOLN
INTRAMUSCULAR | Status: AC
  Filled 2022-04-17: qty 2

## 2022-04-17 MED ORDER — PROPOFOL 10 MG/ML IV BOLUS
INTRAVENOUS | Status: DC | PRN
  Administered 2022-04-17: 130 mg via INTRAVENOUS

## 2022-04-17 MED ORDER — ACETAMINOPHEN 500 MG PO TABS
1000.0000 mg | ORAL_TABLET | Freq: Four times a day (QID) | ORAL | Status: DC
  Administered 2022-04-17 – 2022-04-18 (×5): 1000 mg via ORAL
  Filled 2022-04-17 (×5): qty 2

## 2022-04-17 MED ORDER — DEXAMETHASONE SODIUM PHOSPHATE 10 MG/ML IJ SOLN
INTRAMUSCULAR | Status: AC
  Filled 2022-04-17: qty 1

## 2022-04-17 MED ORDER — EPHEDRINE 5 MG/ML INJ
INTRAVENOUS | Status: AC
  Filled 2022-04-17: qty 5

## 2022-04-17 MED ORDER — METHOCARBAMOL 1000 MG/10ML IJ SOLN: 500.0000 mg | Freq: Three times a day (TID) | INTRAVENOUS | Status: DC | PRN

## 2022-04-17 MED ORDER — CHLORHEXIDINE GLUCONATE 0.12 % MT SOLN
OROMUCOSAL | Status: AC
  Filled 2022-04-17: qty 15

## 2022-04-17 MED ORDER — GABAPENTIN 300 MG PO CAPS
ORAL_CAPSULE | ORAL | Status: AC
  Filled 2022-04-17: qty 1

## 2022-04-17 MED ORDER — FENTANYL CITRATE (PF) 100 MCG/2ML IJ SOLN: 25.0000 ug | INTRAMUSCULAR | Status: DC | PRN

## 2022-04-17 MED ORDER — LACTATED RINGERS IV SOLN: INTRAVENOUS | Status: DC

## 2022-04-17 MED ORDER — HYDRALAZINE HCL 20 MG/ML IJ SOLN: 10.0000 mg | INTRAMUSCULAR | Status: DC | PRN

## 2022-04-17 MED ORDER — BUPIVACAINE LIPOSOME 1.3 % IJ SUSP
INTRAMUSCULAR | Status: DC | PRN
  Administered 2022-04-17: 20 mL

## 2022-04-17 MED ORDER — DIPHENHYDRAMINE HCL 50 MG/ML IJ SOLN: 12.5000 mg | Freq: Four times a day (QID) | INTRAMUSCULAR | Status: DC | PRN

## 2022-04-17 MED ORDER — PHENYLEPHRINE HCL-NACL 20-0.9 MG/250ML-% IV SOLN
INTRAVENOUS | Status: AC
  Filled 2022-04-17: qty 250

## 2022-04-17 MED ORDER — ONDANSETRON HCL 4 MG/2ML IJ SOLN
INTRAMUSCULAR | Status: DC | PRN
  Administered 2022-04-17: 4 mg via INTRAVENOUS

## 2022-04-17 MED ORDER — FENTANYL CITRATE (PF) 100 MCG/2ML IJ SOLN
INTRAMUSCULAR | Status: AC
  Filled 2022-04-17: qty 2

## 2022-04-17 MED ORDER — SODIUM CHLORIDE 0.9 % IV SOLN: INTRAVENOUS | Status: DC

## 2022-04-17 MED ORDER — METHOCARBAMOL 500 MG PO TABS: 500.0000 mg | ORAL_TABLET | Freq: Three times a day (TID) | ORAL | Status: DC | PRN

## 2022-04-17 MED ORDER — ACETAMINOPHEN 500 MG PO TABS
ORAL_TABLET | ORAL | Status: AC
  Filled 2022-04-17: qty 2

## 2022-04-17 MED ORDER — ONDANSETRON 4 MG PO TBDP: 4.0000 mg | ORAL_TABLET | Freq: Four times a day (QID) | ORAL | Status: DC | PRN

## 2022-04-17 MED ORDER — OXYCODONE HCL 5 MG/5ML PO SOLN: 5.0000 mg | Freq: Once | ORAL | Status: AC | PRN

## 2022-04-17 MED ORDER — OXYCODONE HCL 5 MG PO TABS
5.0000 mg | ORAL_TABLET | Freq: Once | ORAL | Status: AC | PRN
  Administered 2022-04-17: 5 mg via ORAL

## 2022-04-17 MED ORDER — LIDOCAINE HCL (CARDIAC) PF 100 MG/5ML IV SOSY
PREFILLED_SYRINGE | INTRAVENOUS | Status: DC | PRN
  Administered 2022-04-17: 100 mg via INTRAVENOUS

## 2022-04-17 MED ORDER — ROCURONIUM BROMIDE 10 MG/ML (PF) SYRINGE
PREFILLED_SYRINGE | INTRAVENOUS | Status: AC
  Filled 2022-04-17: qty 10

## 2022-04-17 MED ORDER — ROCURONIUM BROMIDE 100 MG/10ML IV SOLN
INTRAVENOUS | Status: DC | PRN
  Administered 2022-04-17: 60 mg via INTRAVENOUS
  Administered 2022-04-17 (×2): 20 mg via INTRAVENOUS

## 2022-04-17 MED ORDER — CHLORHEXIDINE GLUCONATE 0.12 % MT SOLN
15.0000 mL | Freq: Once | OROMUCOSAL | Status: AC
  Administered 2022-04-17: 15 mL via OROMUCOSAL

## 2022-04-17 MED ORDER — FENTANYL CITRATE (PF) 100 MCG/2ML IJ SOLN
INTRAMUSCULAR | Status: DC | PRN
  Administered 2022-04-17: 100 ug via INTRAVENOUS

## 2022-04-17 MED ORDER — DEXAMETHASONE SODIUM PHOSPHATE 10 MG/ML IJ SOLN
INTRAMUSCULAR | Status: DC | PRN
  Administered 2022-04-17: 10 mg via INTRAVENOUS

## 2022-04-17 MED ORDER — VISTASEAL 10 ML SINGLE DOSE KIT
PACK | CUTANEOUS | Status: DC | PRN
  Administered 2022-04-17: 10 mL via TOPICAL

## 2022-04-17 MED ORDER — ORAL CARE MOUTH RINSE: 15.0000 mL | Freq: Once | OROMUCOSAL | Status: AC

## 2022-04-17 MED ORDER — HYDROMORPHONE HCL 1 MG/ML IJ SOLN
INTRAMUSCULAR | Status: DC | PRN
  Administered 2022-04-17 (×3): .25 mg via INTRAVENOUS

## 2022-04-17 MED ORDER — VISTASEAL 10 ML SINGLE DOSE KIT
PACK | CUTANEOUS | Status: AC
  Filled 2022-04-17: qty 10

## 2022-04-17 MED ORDER — MELATONIN 5 MG PO TABS: 5.0000 mg | ORAL_TABLET | Freq: Every evening | ORAL | Status: DC | PRN

## 2022-04-17 MED ORDER — HYDROMORPHONE HCL 1 MG/ML IJ SOLN
INTRAMUSCULAR | Status: AC
  Filled 2022-04-17: qty 1

## 2022-04-17 MED ORDER — BUPIVACAINE-EPINEPHRINE (PF) 0.5% -1:200000 IJ SOLN
INTRAMUSCULAR | Status: AC
  Filled 2022-04-17: qty 30

## 2022-04-17 MED ORDER — CEFAZOLIN SODIUM-DEXTROSE 2-4 GM/100ML-% IV SOLN
INTRAVENOUS | Status: AC
  Filled 2022-04-17: qty 100

## 2022-04-17 MED ORDER — BUPIVACAINE HCL 0.25 % IJ SOLN
INTRAMUSCULAR | Status: DC | PRN
  Administered 2022-04-17: 30 mL via INTRAMUSCULAR

## 2022-04-17 MED ORDER — GABAPENTIN 300 MG PO CAPS
300.0000 mg | ORAL_CAPSULE | ORAL | Status: AC
  Administered 2022-04-17: 300 mg via ORAL

## 2022-04-17 MED ORDER — PROPOFOL 10 MG/ML IV BOLUS
INTRAVENOUS | Status: AC
  Filled 2022-04-17: qty 20

## 2022-04-17 MED ORDER — ACETAMINOPHEN 500 MG PO TABS
1000.0000 mg | ORAL_TABLET | ORAL | Status: AC
  Administered 2022-04-17: 1000 mg via ORAL

## 2022-04-17 MED ORDER — OXYCODONE HCL 5 MG PO TABS: 5.0000 mg | ORAL_TABLET | ORAL | Status: DC | PRN

## 2022-04-17 MED ORDER — OXYCODONE HCL 5 MG PO TABS
ORAL_TABLET | ORAL | Status: AC
  Filled 2022-04-17: qty 1

## 2022-04-17 SURGICAL SUPPLY — 56 items
APPLICATOR VISTASEAL 35 (MISCELLANEOUS) IMPLANT
CANNULA REDUC XI 12-8 STAPL (CANNULA) ×2
CANNULA REDUCER 12-8 DVNC XI (CANNULA) ×2 IMPLANT
CLIP LIGATING HEM O LOK PURPLE (MISCELLANEOUS) IMPLANT
DERMABOND ADVANCED .7 DNX12 (GAUZE/BANDAGES/DRESSINGS) ×2 IMPLANT
DRAPE ARM DVNC X/XI (DISPOSABLE) ×8 IMPLANT
DRAPE COLUMN DVNC XI (DISPOSABLE) ×2 IMPLANT
DRAPE DA VINCI XI ARM (DISPOSABLE) ×8
DRAPE DA VINCI XI COLUMN (DISPOSABLE) ×2
ELECT CAUTERY BLADE 6.4 (BLADE) ×2 IMPLANT
ELECT REM PT RETURN 9FT ADLT (ELECTROSURGICAL) ×2
ELECTRODE REM PT RTRN 9FT ADLT (ELECTROSURGICAL) ×2 IMPLANT
GLOVE BIO SURGEON STRL SZ7 (GLOVE) ×6 IMPLANT
GOWN STRL REUS W/ TWL LRG LVL3 (GOWN DISPOSABLE) ×8 IMPLANT
GOWN STRL REUS W/TWL LRG LVL3 (GOWN DISPOSABLE) ×20
GRASPER LAPSCPC 5X45 DSP (INSTRUMENTS) ×2 IMPLANT
IRRIGATION STRYKERFLOW (MISCELLANEOUS) IMPLANT
IRRIGATOR STRYKERFLOW (MISCELLANEOUS)
IV NS 1000ML (IV SOLUTION)
IV NS 1000ML BAXH (IV SOLUTION) IMPLANT
KIT PINK PAD W/HEAD ARE REST (MISCELLANEOUS) ×2
KIT PINK PAD W/HEAD ARM REST (MISCELLANEOUS) ×2 IMPLANT
LABEL OR SOLS (LABEL) ×2 IMPLANT
MANIFOLD NEPTUNE II (INSTRUMENTS) ×2 IMPLANT
MESH BIO-A 7X10 SYN MAT (Mesh General) IMPLANT
NEEDLE HYPO 22GX1.5 SAFETY (NEEDLE) ×2 IMPLANT
OBTURATOR OPTICAL STANDARD 8MM (TROCAR) ×2
OBTURATOR OPTICAL STND 8 DVNC (TROCAR) ×2
OBTURATOR OPTICALSTD 8 DVNC (TROCAR) ×2 IMPLANT
PACK LAP CHOLECYSTECTOMY (MISCELLANEOUS) ×2 IMPLANT
PENCIL SMOKE EVACUATOR (MISCELLANEOUS) ×2 IMPLANT
SEAL CANN UNIV 5-8 DVNC XI (MISCELLANEOUS) ×6 IMPLANT
SEAL XI 5MM-8MM UNIVERSAL (MISCELLANEOUS) ×6
SEALER VESSEL DA VINCI XI (MISCELLANEOUS) ×2
SEALER VESSEL EXT DVNC XI (MISCELLANEOUS) ×2 IMPLANT
SOLUTION ELECTROLUBE (MISCELLANEOUS) ×2 IMPLANT
SPIKE FLUID TRANSFER (MISCELLANEOUS) ×2 IMPLANT
SPONGE T-LAP 18X18 ~~LOC~~+RFID (SPONGE) ×2 IMPLANT
STAPLER CANNULA SEAL DVNC XI (STAPLE) ×2 IMPLANT
STAPLER CANNULA SEAL XI (STAPLE) ×2
SUT MNCRL 4-0 (SUTURE) ×4
SUT MNCRL 4-0 27XMFL (SUTURE) ×4
SUT SILK 2 0 SH (SUTURE) ×4 IMPLANT
SUT VIC AB 3-0 SH 27 (SUTURE)
SUT VIC AB 3-0 SH 27X BRD (SUTURE) IMPLANT
SUT VICRYL 0 UR6 27IN ABS (SUTURE) ×4 IMPLANT
SUT VLOC 90 S/L VL9 GS22 (SUTURE) ×2 IMPLANT
SUTURE MNCRL 4-0 27XMF (SUTURE) ×2 IMPLANT
SYR 30ML LL (SYRINGE) ×2 IMPLANT
SYS BAG RETRIEVAL 10MM (BASKET)
SYSTEM BAG RETRIEVAL 10MM (BASKET) IMPLANT
TRAP FLUID SMOKE EVACUATOR (MISCELLANEOUS) ×2 IMPLANT
TRAY FOLEY SLVR 16FR LF STAT (SET/KITS/TRAYS/PACK) ×2 IMPLANT
TROCAR XCEL NON-BLD 5MMX100MML (ENDOMECHANICALS) ×2 IMPLANT
TUBING EVAC SMOKE HEATED PNEUM (TUBING) ×2 IMPLANT
WATER STERILE IRR 500ML POUR (IV SOLUTION) ×2 IMPLANT

## 2022-04-17 NOTE — Interval H&P Note (Signed)
History and Physical Interval Note:  04/17/2022 7:22 AM  Jonathan Mayer  has presented today for surgery, with the diagnosis of paraesophageal hernia.  The various methods of treatment have been discussed with the patient and family. After consideration of risks, benefits and other options for treatment, the patient has consented to  Procedure(s): XI ROBOTIC ASSISTED PARAESOPHAGEAL HERNIA REPAIR, RNFA to assist (N/A) as a surgical intervention.  The patient's history has been reviewed, patient examined, no change in status, stable for surgery.  I have reviewed the patient's chart and labs.  Questions were answered to the patient's satisfaction.     [place]

## 2022-04-17 NOTE — Op Note (Signed)
Robotic assisted laparoscopic repair of  paraesophageal  hernia with Bio-A Mesh and Nissen fundoplication  Pre-operative Diagnosis: Type I paraesophageal hernia, GERD  Post-operative Diagnosis: same  Procedure:  Robotic assisted laparoscopic repair of  paraesophageal  hernia with Bio-A Mesh and Nissen fundoplication  Surgeon: Caroleen Hamman, MD FACS  Assistant: Carollee Leitz RNFA . Required due to the complexity of the case the need for exposure and lack of first assist.  Anesthesia: Gen. with endotracheal tube  Findings: Type I paraesophageal hernia w  fundus within mediastinum Loose wrap 360 degree over 50 FR Bougie   Estimated Blood Loss: 10cc       Specimens: sac           Complications: none   Procedure Details  The patient was seen again in the Holding Room. The benefits, complications, treatment options, and expected outcomes were discussed with the patient. The risks of bleeding, infection, recurrence of symptoms, failure to resolve symptoms,  esophageal damage, Dysphagia, bowel injury, any of which could require further surgery were reviewed with the patient. The likelihood of improving the patient's symptoms with return to their baseline status is good.  The patient and/or family concurred with the proposed plan, giving informed consent.  The patient was taken to Operating Room, identified  and the procedure verified.  A Time Out was held and the above information confirmed.  Prior to the induction of general anesthesia, antibiotic prophylaxis was administered. VTE prophylaxis was in place. General endotracheal anesthesia was then administered and tolerated well. After the induction, the abdomen was prepped with Chloraprep and draped in the sterile fashion. The patient was positioned in the supine position.  Cut down technique was used to enter the abdominal cavity and a Hasson trochar was placed after two vicryl stitches were anchored to the fascia. Pneumoperitoneum was  then created with CO2 and tolerated well without any adverse changes in the patient's vital signs.  Three 8-mm ports were placed under direct vision. All skin incisions  were infiltrated with a local anesthetic agent before making the incision and placing the trocars. An additional 5 mm regular laparoscopic port was placed to assist with retraction and exposure.   The patient was positioned  in reverse Trendelenburg, robot was brought to the surgical field and docked in the standard fashion.  We made sure all the instrumentation was kept indirect view at all times and that there were no collision between the arms. I scrubbed out and went to the console.  I used a robotic arm to retract the liver, the vessel sealer on my right hand and a forced bipolar grasper on my left hand.  There is along the extra 5 mm port allow me ample exposure and the ability to perform meticulous dissection  We Started dividing the lesser omentum via the pars flaccida.  We Were able to dissect the lesser curvature of the stomach and  dissected the fundus free from the right and left crus.  We circumferentially dissected the GE junction.  The hernia sac was also completely reduced and we were able to bring the stomach into the intra-abdominal position.  Attention then was turned to the greater curvature where the short gastrics were divided with sealer device.  We were able to identify the left crus and again were able to make sure there was a good circumferential dissection and that the hernia sac was completely excised.  We did perform a good dissection within the mediastinum to allow a complete reduction of the sac,  gain esophageal length and a to completely allow an intra-abdominal fundoplication. Using two strips of Bio-A as pledgets we approximated the crus with a 2-0V lock suture. A bio-A 10x7 cm mesh was inserted and secured using Vistaseal.   We Asked anesthesia to place a 50 French bougie and this went easily.  We also  observe trajectory of the bougie. 360 degree Nissen fundoplication was created with multiple 2-0 silk sutures and we placed 3 stitches taking some of the esophagus within that bite.  The fundoplication measured approximately 3-1/2 cm and he was floppy. I was very happy with the way the fundoplication laid and the repair of the hernia.  Inspection of the  upper quadrant was performed. No bleeding, bile  Or esophageal injuries leaks, or bowel injuries were noted. Robotic instruments and robotic arms were undocked in the standard fashion. All the needles were removed under direct visualization.   I scrubbed back in.  Pneumoperitoneum was released.  The periumbilical port site was closed with interrumpted 0 Vicryl sutures. 4-0 subcuticular Monocryl was used to close the skin. Liposomal marcaine was injected to all the incisions sites.  Dermabond was  applied.  The patient was then extubated and brought to the recovery room in stable condition. Sponge, lap, and needle counts were correct at closure and at the conclusion of the case.               Caroleen Hamman, MD, FACS

## 2022-04-17 NOTE — Transfer of Care (Signed)
Immediate Anesthesia Transfer of Care Note  Patient: Jonathan Mayer  Procedure(s) Performed: XI ROBOTIC ASSISTED PARAESOPHAGEAL HERNIA REPAIR, RNFA to assist INSERTION OF MESH  Patient Location: PACU  Anesthesia Type:General  Level of Consciousness: awake, alert , and oriented  Airway & Oxygen Therapy: Patient Spontanous Breathing and Patient connected to face mask oxygen  Post-op Assessment: Report given to RN and Post -op Vital signs reviewed and stable  Post vital signs: stable  Last Vitals:  Vitals Value Taken Time  BP 148/85 04/17/22 1017  Temp 36.2 C 04/17/22 1015  Pulse 80 04/17/22 1022  Resp 18 04/17/22 1022  SpO2 98 % 04/17/22 1022  Vitals shown include unvalidated device data.  Last Pain:  Vitals:   04/17/22 1015  TempSrc:   PainSc: Asleep         Complications: No notable events documented.

## 2022-04-17 NOTE — Anesthesia Preprocedure Evaluation (Signed)
Anesthesia Evaluation  Patient identified by MRN, date of birth, ID band Patient awake    Reviewed: Allergy & Precautions, NPO status , Patient's Chart, lab work & pertinent test results  History of Anesthesia Complications Negative for: history of anesthetic complications  Airway Mallampati: III  TM Distance: <3 FB Neck ROM: full    Dental  (+) Chipped   Pulmonary neg shortness of breath   Pulmonary exam normal        Cardiovascular Exercise Tolerance: Good (-) angina (-) Past MI Normal cardiovascular exam     Neuro/Psych  Headaches  negative psych ROS   GI/Hepatic Neg liver ROS, hiatal hernia,GERD  Controlled,,  Endo/Other  negative endocrine ROSneg diabetes    Renal/GU      Musculoskeletal   Abdominal   Peds  Hematology negative hematology ROS (+)   Anesthesia Other Findings Past Medical History: No date: Arthritis No date: Family history of adverse reaction to anesthesia     Comment:  MOM-BP DROPPED No date: GERD (gastroesophageal reflux disease) No date: Headache     Comment:  MIGRAINES No date: History of hiatal hernia No date: Pneumonia     Comment:  X2 AGE 62 AND IN HIS 30'S  Past Surgical History: 10/10/2021: CARPOMETACARPAL ([place]) FUSION OF THUMB; Left     Comment:  Procedure: CARPOMETACARPAL ([place]) FUSION OF THUMB;                Surgeon: Earnestine Leys, MD;  Location: [facility];                Service: Orthopedics;  Laterality: Left; 2015: COLONOSCOPY 02/03/2022: COLONOSCOPY WITH PROPOFOL; N/A     Comment:  Procedure: COLONOSCOPY WITH PROPOFOL;  Surgeon: Lucilla Lame, MD;  Location: [place];  Service:               Endoscopy;  Laterality: N/A; No date: ESOPHAGOGASTRODUODENOSCOPY 02/03/2022: ESOPHAGOGASTRODUODENOSCOPY (EGD) WITH PROPOFOL; N/A     Comment:  Procedure: ESOPHAGOGASTRODUODENOSCOPY (EGD) WITH               PROPOFOL;  Surgeon: Lucilla Lame, MD;  Location:  [place];  Service: Endoscopy;  Laterality: N/A; 05/25/2018: HEMORRHOID SURGERY; N/A     Comment:  Procedure: HEMORRHOIDECTOMY;  Surgeon: [name],              MD;  Location: [facility];  Service: General;  Laterality:               N/A; No date: HERNIA REPAIR; Bilateral     Comment:  20'S No date: KNEE ARTHROSCOPY W/ MENISCAL REPAIR; Right 02/03/2022: POLYPECTOMY; N/A     Comment:  Procedure: POLYPECTOMY;  Surgeon: Lucilla Lame, MD;                Location: [place];  Service: Endoscopy;                Laterality: N/A; No date: TONSILLECTOMY AND ADENOIDECTOMY No date: TRIGGER FINGER RELEASE     Comment:  x3  BMI    Body Mass Index: 32.91 kg/m      Reproductive/Obstetrics negative OB ROS                             Anesthesia Physical Anesthesia Plan  ASA:  3  Anesthesia Plan: General ETT   Post-op Pain Management:    Induction: Intravenous  PONV Risk Score and Plan: Ondansetron, Dexamethasone, Midazolam and Treatment may vary due to age or medical condition  Airway Management Planned: Oral ETT  Additional Equipment:   Intra-op Plan:   Post-operative Plan: Extubation in OR  Informed Consent: I have reviewed the patients History and Physical, chart, labs and discussed the procedure including the risks, benefits and alternatives for the proposed anesthesia with the patient or authorized representative who has indicated his/her understanding and acceptance.     Dental Advisory Given  Plan Discussed with: Anesthesiologist, CRNA and Surgeon  Anesthesia Plan Comments: (Patient consented for risks of anesthesia including but not limited to:  - adverse reactions to medications - damage to eyes, teeth, lips or other oral mucosa - nerve damage due to positioning  - sore throat or hoarseness - Damage to heart, brain, nerves, lungs, other parts of body or loss of life  Patient voiced understanding.)        Anesthesia Quick Evaluation

## 2022-04-17 NOTE — Discharge Instructions (Addendum)
In addition to included general post-operative instructions,  Diet: Recommend following Nissen diet recommendations x4 week minimum; you were given a hand out regarding this.   Activity: No heavy lifting >20 pounds (children, pets, laundry, garbage) or strenuous activity for 4 weeks, but light activity and walking are encouraged. Do not drive or drink alcohol if taking narcotic pain medications or having pain that might distract from driving.  Wound care: 2 days after surgery (01/27), you may shower/get incision wet with soapy water and pat dry (do not rub incisions), but no baths or submerging incision underwater until follow-up.   Medications: Resume all home medications. For mild to moderate pain: acetaminophen (Tylenol) or ibuprofen/naproxen (if no kidney disease). Combining Tylenol with alcohol can substantially increase your risk of causing liver disease. Narcotic pain medications, if prescribed, can be used for severe pain, though may cause nausea, constipation, and drowsiness. Do not combine Tylenol and Percocet (or similar) within a 6 hour period as Percocet (and similar) contain(s) Tylenol. If you do not need the narcotic pain medication, you do not need to fill the prescription.  Call office [phone] / [phone]) at any time if any questions, worsening pain, fevers/chills, bleeding, drainage from incision site, or other concerns.   AMBULATORY SURGERY  DISCHARGE INSTRUCTIONS   The drugs that you were given will stay in your system until tomorrow so for the next 24 hours you should not:  Drive an automobile Make any legal decisions Drink any alcoholic beverage   You may resume regular meals tomorrow.  Today it is better to start with liquids and gradually work up to solid foods.  You may eat anything you prefer, but it is better to start with liquids, then soup and crackers, and gradually work up to solid foods.   Please notify your doctor immediately if you have any  unusual bleeding, trouble breathing, redness and pain at the surgery site, drainage, fever, or pain not relieved by medication.    Your post-operative visit with Dr.                                       is: Date:                        Time:    Please call to schedule your post-operative visit.  Additional Instructions:

## 2022-04-17 NOTE — Anesthesia Postprocedure Evaluation (Signed)
Anesthesia Post Note  Patient: Jonathan Mayer  Procedure(s) Performed: XI ROBOTIC ASSISTED PARAESOPHAGEAL HERNIA REPAIR, RNFA to assist INSERTION OF MESH  Patient location during evaluation: PACU Anesthesia Type: General Level of consciousness: awake and alert Pain management: pain level controlled Vital Signs Assessment: post-procedure vital signs reviewed and stable Respiratory status: spontaneous breathing, nonlabored ventilation, respiratory function stable and patient connected to nasal cannula oxygen Cardiovascular status: blood pressure returned to baseline and stable Postop Assessment: no apparent nausea or vomiting Anesthetic complications: no   No notable events documented.   Last Vitals:  Vitals:   04/17/22 1130 04/17/22 1150  BP: (!) 163/98 (!) 160/99  Pulse: 83 83  Resp: 16 16  Temp: 36.6 C 36.7 C  SpO2: 98% 94%    Last Pain:  Vitals:   04/17/22 1150  TempSrc:   PainSc: 4                  Precious Haws Yvon Mccord

## 2022-04-17 NOTE — Anesthesia Procedure Notes (Signed)
Procedure Name: Intubation Date/Time: 04/17/2022 7:41 AM  Performed by: Natasha Mead, CRNAPre-anesthesia Checklist: Patient identified, Emergency Drugs available, Suction available and Patient being monitored Patient Re-evaluated:Patient Re-evaluated prior to induction Oxygen Delivery Method: Circle system utilized Preoxygenation: Pre-oxygenation with 100% oxygen Induction Type: IV induction Ventilation: Mask ventilation without difficulty Laryngoscope Size: Miller and 2 Grade View: Grade I Tube type: Oral Tube size: 7.0 mm Number of attempts: 1 Airway Equipment and Method: Stylet and Oral airway Placement Confirmation: ETT inserted through vocal cords under direct vision, positive ETCO2 and breath sounds checked- equal and bilateral Secured at: 21 cm Tube secured with: Tape Dental Injury: Teeth and Oropharynx as per pre-operative assessment

## 2022-04-18 DIAGNOSIS — K449 Diaphragmatic hernia without obstruction or gangrene: Secondary | ICD-10-CM | POA: Diagnosis not present

## 2022-04-18 LAB — BASIC METABOLIC PANEL
Anion gap: 6 (ref 5–15)
BUN: 16 mg/dL (ref 8–23)
CO2: 23 mmol/L (ref 22–32)
Calcium: 8.6 mg/dL — ABNORMAL LOW (ref 8.9–10.3)
Chloride: 107 mmol/L (ref 98–111)
Creatinine, Ser: 1.08 mg/dL (ref 0.61–1.24)
GFR, Estimated: >60 mL/min (ref >60)
Glucose, Bld: 116 mg/dL — ABNORMAL HIGH (ref 70–99)
Potassium: 4.3 mmol/L (ref 3.5–5.1)
Sodium: 136 mmol/L (ref 135–145)

## 2022-04-18 LAB — SURGICAL PATHOLOGY

## 2022-04-18 LAB — MAGNESIUM: Magnesium: 1.9 mg/dL (ref 1.7–2.4)

## 2022-04-18 MED ORDER — OXYCODONE HCL 5 MG PO TABS: 5.0000 mg | ORAL_TABLET | Freq: Four times a day (QID) | ORAL | 0 refills | Status: DC | PRN

## 2022-04-18 NOTE — Progress Notes (Signed)
[name] to be discharged Home per MD order. Discussed prescriptions and follow up appointments with the patient. Prescriptions given to patient, medication list explained in detail. Patient verbalized understanding.  Allergies as of 04/18/2022       Reactions   Aspirin Itching, Other (See Comments)   Eyes swell   Ibuprofen Itching, Other (See Comments)   Eyes swell   Dexilant [dexlansoprazole] Rash   Terbinafine And Related Hives   Happened with sun exposure, resolved with Benadryl        Medication List     TAKE these medications    esomeprazole 20 MG capsule Commonly known as: NEXIUM Take 40 mg by mouth at bedtime.   fluticasone 50 MCG/ACT nasal spray Commonly known as: FLONASE Place 1 spray into both nostrils every morning.   oxyCODONE 5 MG immediate release tablet Commonly known as: Oxy IR/ROXICODONE Take 1 tablet (5 mg total) by mouth every 6 (six) hours as needed for severe pain or breakthrough pain.        Vitals:   04/18/22 0340 04/18/22 0846  BP: 131/69 138/87  Pulse: 73 67  Resp: 18 17  Temp: 98.1 F (36.7 C) 98.4 F (36.9 C)  SpO2: 94% 95%    Skin clean, dry and intact without evidence of skin break down and or skin tears. IV catheter discontinued intact. Site without signs and symptoms of complications. Dressing and pressure applied. Patient denies pain at this time. No complaints noted.  An After Visit Summary was printed and given to the patient. Patient escorted via wheelchair and discharged Home home via private auto.  Fuller Mandril, RN

## 2022-04-18 NOTE — Plan of Care (Signed)
  Problem: Health Behavior/Discharge Planning: Goal: Ability to manage health-related needs will improve Outcome: Progressing   Problem: Nutrition: Goal: Adequate nutrition will be maintained Outcome: Progressing   Problem: Pain Managment: Goal: General experience of comfort will improve Outcome: Progressing   

## 2022-04-18 NOTE — TOC CM/SW Note (Signed)
Patient has orders to discharge home today. Chart reviewed. No TOC needs identified. CSW signing off.  Dayton Scrape, Ashley

## 2022-04-18 NOTE — Discharge Summary (Signed)
[facility] SURGICAL DISCHARGE SUMMARY  Patient ID: Jonathan Mayer MRN: [id number] DOB/AGE: 1960/07/14 62 y.o.  Admit date: 04/17/2022 Discharge date: 04/18/2022  Discharge Diagnoses Patient Active Problem List   Diagnosis Date Noted   S/P repair of paraesophageal hernia 04/17/2022   Hiatal hernia with gastroesophageal reflux 03/13/2017    Consultants None  Procedures 04/17/2022:  Robotic assisted laparoscopic paraesophageal hernia repair with Nissen fundoplication  HPI: Jonathan Mayer is a 62 y.o. male with a history of recalcitrant reflux who presents to [facility] on 01/25 for scheduled paraesophageal hernia repair with Dr Dahlia Byes.   Hospital Course: Informed consent was obtained and documented, and patient underwent uneventful robotic assisted laparoscopic paraesophageal hernia repair with Nissen fundoplication (Dr Dahlia Byes, 98/92/1194).  Post-operatively, patient did wel;. Advancement of patient's diet and ambulation were well-tolerated. The remainder of patient's hospital course was essentially unremarkable, and discharge planning was initiated accordingly with patient safely able to be discharged home with appropriate discharge instructions, pain control, and outpatient follow-up after all of his and his family's questions were answered to their expressed satisfaction.   Discharge Condition: Good    Physical Examination:  Constitutional: Well appearing male, NAD Pulmonary: Normal effort, no respiratory distress Gastrointestinal: Soft, incisional soreness, non-distended, no rebound/guarding Skin: Laparoscopic incisions are CDI with dermabond, no erythema or drainage    Allergies as of 04/18/2022       Reactions   Aspirin Itching, Other (See Comments)   Eyes swell   Ibuprofen Itching, Other (See Comments)   Eyes swell   Dexilant [dexlansoprazole] Rash   Terbinafine And Related Hives   Happened with sun exposure, resolved with Benadryl        Medication List      TAKE these medications    esomeprazole 20 MG capsule Commonly known as: NEXIUM Take 40 mg by mouth at bedtime.   fluticasone 50 MCG/ACT nasal spray Commonly known as: FLONASE Place 1 spray into both nostrils every morning.   oxyCODONE 5 MG immediate release tablet Commonly known as: Oxy IR/ROXICODONE Take 1 tablet (5 mg total) by mouth every 6 (six) hours as needed for severe pain or breakthrough pain.          Follow-up Information     Pabon, [place] F, MD. Schedule an appointment as soon as possible for a visit in 3 week(s).   Specialty: General Surgery Why: s/p paraesophageal hernia repair Contact information: [address] [place] [address] [phone]                  Time spent on discharge management including discussion of hospital course, clinical condition, outpatient instructions, prescriptions, and follow up with the patient and members of the medical team: >30 minutes  -- Edison Simon , PA-C [facility]  04/18/2022, 8:57 AM [phone] M-F: 7am - 4pm

## 2022-04-21 ENCOUNTER — Telehealth: Payer: Self-pay

## 2022-04-21 NOTE — Telephone Encounter (Signed)
Transition Care Management Follow-up Telephone Call Date of discharge and from where: [place] 04/18/2022 How have you been since you were released from the hospital? btte Any questions or concerns? Yes  Items Reviewed: Did the pt receive and understand the discharge instructions provided? Yes  Medications obtained and verified? Yes  Other? No  Any new allergies since your discharge? No  Dietary orders reviewed? Yes Do you have support at home? Yes   Home Care and Equipment/Supplies: Were home health services ordered? no If so, what is the name of the agency? N/a  Has the agency set up a time to come to the patient's home? not applicable Were any new equipment or medical supplies ordered?  No What is the name of the medical supply agency? N/a Were you able to get the supplies/equipment? not applicable Do you have any questions related to the use of the equipment or supplies? No  Functional Questionnaire: (I = Independent and D = Dependent) ADLs: I  Bathing/Dressing- I  Meal Prep- I  Eating- I  Maintaining continence- I  Transferring/Ambulation- I  Managing Meds- I  Follow up appointments reviewed:  PCP Hospital f/u appt confirmed? No   Specialist Hospital f/u appt confirmed? Yes  Scheduled to see Dr Dahlia Byes on 05/05/2022 @ 9:15. Are transportation arrangements needed? No  If their condition worsens, is the pt aware to call PCP or go to the Emergency Dept.? Yes Was the patient provided with contact information for the PCP's office or ED? Yes Was to pt encouraged to call back with questions or concerns? Yes  Juanda Crumble, LPN [place] Direct Dial [phone]

## 2022-05-05 ENCOUNTER — Other Ambulatory Visit: Payer: Self-pay

## 2022-05-05 ENCOUNTER — Encounter: Payer: Self-pay | Admitting: Surgery

## 2022-05-05 ENCOUNTER — Ambulatory Visit (INDEPENDENT_AMBULATORY_CARE_PROVIDER_SITE_OTHER): Payer: 59 | Admitting: Surgery

## 2022-05-05 VITALS — BP 144/102 | HR 79 | Temp 98.4°F | Ht 67.0 in | Wt 202.0 lb

## 2022-05-05 DIAGNOSIS — K449 Diaphragmatic hernia without obstruction or gangrene: Secondary | ICD-10-CM

## 2022-05-05 DIAGNOSIS — Z09 Encounter for follow-up examination after completed treatment for conditions other than malignant neoplasm: Secondary | ICD-10-CM

## 2022-05-05 DIAGNOSIS — R1310 Dysphagia, unspecified: Secondary | ICD-10-CM

## 2022-05-05 NOTE — Progress Notes (Signed)
Jonathan Mayer is 2-1/2 weeks status post Nissen fundoplication with repair of hiatal hernia.  He is doing well. Following well.  Tolerating liquids he has been doing some soft meals but he is a little bit hesitant. True evidence of dysphagia. Endorses some reflux after dairy. Pain.  He is walking and he is driving  PE NAD  Chest CTA, nsr Abd: Soft incisions healing well without infection.  No peritonitis   A/P overall doing well.  He reports that the reflux has improved as well as his voice but not 100% regarding reflux.  He states about two thirds better. Might be related to intolerances. Have some soft meals. I will order a barium swallow to make sure there is no issues related to the fundoplication.  I will see him back in a couple weeks or so

## 2022-05-05 NOTE — Patient Instructions (Addendum)
Barium Swallow scheduled at [facility] on 05/12/22 @ 9:30. Nothing to eat/drink 3 hours prior. Please see your follow up appointment listed below.

## 2022-05-07 ENCOUNTER — Ambulatory Visit: Payer: Self-pay

## 2022-05-07 NOTE — Telephone Encounter (Signed)
2nd attempt, Patient called, left VM to return the call to the office to discuss symptoms with a nurse.

## 2022-05-07 NOTE — Telephone Encounter (Signed)
ummary: ankle swelling and painful left foot   Pt has left ankle swelling and the bottom of his left foot is experiencing pain / pt scheduled appt for tomorrow morning / please advise  Left message to call back about symptoms.

## 2022-05-07 NOTE — Telephone Encounter (Signed)
3rd attempt, Patient called, left VM to return the call to the office to discuss symptoms with a nurse. Unable to reach patient after 3 attempts by [facility] NT, routing to the provider for resolution per protocol.  Summary: ankle swelling and painful left foot   Pt has left ankle swelling and the bottom of his left foot is experiencing pain / pt scheduled appt for tomorrow morning / please advise

## 2022-05-07 NOTE — Telephone Encounter (Signed)
  Has appointment tomorrow,will keep appointment. Reason for Disposition  [1] Swollen foot AND [2] no fever  (Exceptions: localized bump from bunions, calluses, insect bite, sting)  Answer Assessment - Initial Assessment Questions 1. ONSET: "When did the pain start?"      Several days  2. LOCATION: "Where is the pain located?"      Left foot moderate 3. PAIN: "How bad is the pain?"    (Scale 1-10; or mild, moderate, severe)  - MILD (1-3): doesn't interfere with normal activities.   - MODERATE (4-7): interferes with normal activities (e.g., work or school) or awakens from sleep, limping.   - SEVERE (8-10): excruciating pain, unable to do any normal activities, unable to walk.      Moderate 4. WORK OR EXERCISE: "Has there been any recent work or exercise that involved this part of the body?"      No 5. CAUSE: "What do you think is causing the foot pain?"     Unsure 6. OTHER SYMPTOMS: "Do you have any other symptoms?" (e.g., leg pain, rash, fever, numbness)     Pain 7. PREGNANCY: "Is there any chance you are pregnant?" "When was your last menstrual period?"     N/a  Protocols used: Foot Pain-A-AH

## 2022-05-08 ENCOUNTER — Ambulatory Visit: Payer: 59 | Admitting: Physician Assistant

## 2022-05-08 ENCOUNTER — Encounter: Payer: Self-pay | Admitting: Physician Assistant

## 2022-05-08 VITALS — BP 138/88 | HR 74 | Temp 97.7°F | Resp 16 | Ht 67.0 in | Wt 205.5 lb

## 2022-05-08 DIAGNOSIS — M722 Plantar fascial fibromatosis: Secondary | ICD-10-CM

## 2022-05-08 NOTE — Patient Instructions (Addendum)
You can do the exercises I have included as well as freezing water bottle and rolling this along your foot and heel to help stretch the fascial tissue and provide relief You can do the same with a tennis ball or massage ball as well   Make sure you are wearing supportive shoes and recommend orthotics to help prevent further inflammation   You can try Voltaren gel or a Lidocaine patch to help with pain management. You can continue to use Tylenol as well.

## 2022-05-08 NOTE — Assessment & Plan Note (Signed)
Acute, new concern Reports pain in left foot and heel for the past week or so after walking without his usual walking shoes  Reports pain is sometimes excruciating when weight is placed on the foot but minimal pain and discomfort with non-weight bearing movement and ROM PE was overall unremarkable with the exception of mild swelling of left ankle  He has an apt with [facility] next week for this same concern Reviewed conservative measures for plantar fasciitis = stretches, wearing appropriate and supportive shoes, using frozen water bottle and tennis ball to help stretch fascia, Tylenol and topical use of Voltaren and Lidocaine patches as tolerated  Follow up as needed and will defer to Podiatry recommendations after patient is evaluated by them

## 2022-05-08 NOTE — Progress Notes (Signed)
Acute Office Visit   Patient: Jonathan Mayer   DOB: Oct 07, 1960   62 y.o. Male  MRN: [id number] Visit Date: 05/08/2022  Today's healthcare provider: Dani Gobble Jaqualyn Juday, PA-C  Introduced myself to the patient as a Journalist, newspaper and provided education on APPs in clinical practice.    Chief Complaint  Patient presents with   Foot Pain    Left/heel x 1 week    Subjective    HPI HPI     Foot Pain    Additional comments: Left/heel x 1 week       Last edited by Royal Hawthorn, CMA on 05/08/2022 10:07 AM.      Left heel and foot pain  Onset: sudden  Duration: 1 week- started last Wed as he has been recovering from surgery and walking regularly  He reports Wed he was not wearing his normal walking shoes for his regular walking and noted Thurs that his foot was hurting   Location: left heel and  Radiation: none   Pain level and character: becomes excruciating with weight bearing activities  Injury or trauma to area: none  Interventions:Has been using his leftover Oxycodone from surgery to assist with nighttime pain, Tried Tylenol at first  Alleviating: pain medications  Aggravating: placing weight on heel can be excruciating  Associated symptoms: some pain/ nodule in mid calf with palpation, ankle seems a bit swollen but ankle is not hurting  Reports ROM is normal and does not hurt with movements as long as weight is not applied  Relevant hx: plantar fasciitis - minor cases in the past   He has an apt with [facility] next week   Medications: Outpatient Medications Prior to Visit  Medication Sig   fluticasone (FLONASE) 50 MCG/ACT nasal spray Place 1 spray into both nostrils every morning.    esomeprazole (NEXIUM) 20 MG capsule Take 40 mg by mouth at bedtime. (Patient not taking: Reported on 05/08/2022)   No facility-administered medications prior to visit.    Review of Systems  Musculoskeletal:        Left foot and heel pain         Objective    BP 138/88 (BP  Location: Right Arm, Patient Position: Sitting, Cuff Size: Normal)   Pulse 74   Temp 97.7 F (36.5 C) (Oral)   Resp 16   Ht 5' 7"$  (1.702 m)   Wt 205 lb 8 oz (93.2 kg)   SpO2 96%   BMI 32.19 kg/m    Physical Exam Vitals reviewed.  Constitutional:      Appearance: Normal appearance.  HENT:     Head: Normocephalic and atraumatic.  Cardiovascular:     Pulses:          Posterior tibial pulses are 2+ on the left side.  Musculoskeletal:     Left ankle: Swelling present. No tenderness. Normal range of motion. Anterior drawer test negative. Normal pulse.     Right foot: Normal range of motion.     Left foot: Normal range of motion. No deformity or bunion.       Legs:  Feet:     Left foot:     Skin integrity: Skin integrity normal. No warmth or dry skin.  Neurological:     Mental Status: He is alert.       No results found for any visits on 05/08/22.  Assessment & Plan      No follow-ups on file.  Problem List Items Addressed This Visit       Musculoskeletal and Integument   Plantar fasciitis of left foot - Primary    Acute, new concern Reports pain in left foot and heel for the past week or so after walking without his usual walking shoes  Reports pain is sometimes excruciating when weight is placed on the foot but minimal pain and discomfort with non-weight bearing movement and ROM PE was overall unremarkable with the exception of mild swelling of left ankle  He has an apt with [facility] next week for this same concern Reviewed conservative measures for plantar fasciitis = stretches, wearing appropriate and supportive shoes, using frozen water bottle and tennis ball to help stretch fascia, Tylenol and topical use of Voltaren and Lidocaine patches as tolerated  Follow up as needed and will defer to Podiatry recommendations after patient is evaluated by them          No follow-ups on file.   I, Shakiera Edelson E Roni Friberg, PA-C, have reviewed all documentation for  this visit. The documentation on 05/08/22 for the exam, diagnosis, procedures, and orders are all accurate and complete.   Talitha Givens, MHS, PA-C [facility]

## 2022-05-12 ENCOUNTER — Ambulatory Visit
Admission: RE | Admit: 2022-05-12 | Discharge: 2022-05-12 | Disposition: A | Discharge status: Home / Self Care | Payer: 59 | Source: Ambulatory Visit | Attending: Surgery | Admitting: Surgery

## 2022-05-12 ENCOUNTER — Telehealth: Payer: Self-pay

## 2022-05-12 DIAGNOSIS — R1310 Dysphagia, unspecified: Secondary | ICD-10-CM

## 2022-05-12 MED ORDER — IOHEXOL 300 MG/ML  SOLN
180.0000 mL | Freq: Once | INTRAMUSCULAR | Status: AC | PRN
  Administered 2022-05-12: 180 mL via ORAL

## 2022-05-12 NOTE — Telephone Encounter (Signed)
Patient notified of results.Reminded to keep appointment for Wednesday.

## 2022-05-14 ENCOUNTER — Ambulatory Visit (INDEPENDENT_AMBULATORY_CARE_PROVIDER_SITE_OTHER): Payer: 59 | Admitting: Surgery

## 2022-05-14 ENCOUNTER — Encounter: Payer: Self-pay | Admitting: Surgery

## 2022-05-14 ENCOUNTER — Ambulatory Visit: Payer: 59 | Admitting: Podiatry

## 2022-05-14 ENCOUNTER — Ambulatory Visit (INDEPENDENT_AMBULATORY_CARE_PROVIDER_SITE_OTHER): Payer: 59

## 2022-05-14 VITALS — BP 142/86 | HR 75 | Temp 98.5°F | Ht 67.0 in | Wt 200.6 lb

## 2022-05-14 DIAGNOSIS — K449 Diaphragmatic hernia without obstruction or gangrene: Secondary | ICD-10-CM

## 2022-05-14 DIAGNOSIS — M722 Plantar fascial fibromatosis: Secondary | ICD-10-CM | POA: Diagnosis not present

## 2022-05-14 DIAGNOSIS — Z09 Encounter for follow-up examination after completed treatment for conditions other than malignant neoplasm: Secondary | ICD-10-CM

## 2022-05-14 NOTE — Patient Instructions (Signed)

## 2022-05-14 NOTE — Patient Instructions (Signed)
Chew your food well. No bread or carbonated beverages.Please see your appointment listed below.

## 2022-05-14 NOTE — Progress Notes (Signed)
  Subjective:  Patient ID: Jonathan Mayer, male    DOB: 11-Dec-1960,  MRN: [id number]  Chief Complaint  Patient presents with   Plantar Fasciitis    Left heel pain -started a couple weeks ago -PCP gave exercises -doing better but wanted to make sure his foot is ok    62 y.o. male presents with the above complaint. History confirmed with patient.  Starting to improve and ease up back to wearing the shoes that he knows are supportive for him  Objective:  Physical Exam: warm, good capillary refill, no trophic changes or ulcerative lesions, normal DP and PT pulses, normal sensory exam, and little to no tenderness plantar left heel   Radiographs: Multiple views x-ray of the left foot: no fracture, dislocation, swelling or degenerative changes noted and plantar calcaneal spur Assessment:   1. Plantar fasciitis, left      Plan:  Patient was evaluated and treated and all questions answered.    Discussed the etiology and treatment options for plantar fasciitis including stretching, formal physical therapy, supportive shoegears such as a running shoe or sneaker, pre fabricated orthoses, injection therapy, and oral medications. We also discussed the role of surgical treatment of this for patients who do not improve after exhausting non-surgical treatment options.   -XR reviewed with patient -Educated patient on stretching and icing of the affected limb -RTC to be resolving for him.  We discussed the option of corticosteroid injection oral medications, he will begin home therapy plan continue this if not improving return for injection  Return if symptoms worsen or fail to improve.

## 2022-05-15 ENCOUNTER — Encounter: Payer: Self-pay | Admitting: Surgery

## 2022-05-15 NOTE — Progress Notes (Signed)
Male is 3 and half weeks from robotic Nissen fundoplication and hiatal hernia repair.  He feels better today and his symptoms related to reflux have improved significantly.  He has been ambulating.  He did have a recent swallow evaluation showing an intact wrap and there is some evidence of reflux.  No evidence of complications. He is off PPI therapy  PE NAD ABd: Soft, incisions healing well without infection or peritonitis  A/P 62 year old status post robotic Nissen fundoplication doing well considering.  Significant improvement in reflux symptoms although not at 100% probably about 70% improved.  I still recommend waiting before anything else is attempted.  He understands that he may need to take some PPI but he is hoping that time will improve some of the remaining reflux. May start advancing diet RTC 2 months

## 2022-07-16 ENCOUNTER — Encounter: Payer: Self-pay | Admitting: Surgery

## 2022-07-16 ENCOUNTER — Ambulatory Visit (INDEPENDENT_AMBULATORY_CARE_PROVIDER_SITE_OTHER): Payer: 59 | Admitting: Surgery

## 2022-07-16 VITALS — BP 125/77 | HR 76 | Temp 98.8°F | Ht 67.0 in | Wt 193.6 lb

## 2022-07-16 DIAGNOSIS — K449 Diaphragmatic hernia without obstruction or gangrene: Secondary | ICD-10-CM

## 2022-07-16 DIAGNOSIS — K219 Gastro-esophageal reflux disease without esophagitis: Secondary | ICD-10-CM

## 2022-07-16 DIAGNOSIS — Z09 Encounter for follow-up examination after completed treatment for conditions other than malignant neoplasm: Secondary | ICD-10-CM

## 2022-07-16 NOTE — Patient Instructions (Signed)
If you have any concerns or questions, please feel free to call our office. Follow up as needed.   Laparoscopic Nissen Fundoplication, Care After The following information offers guidance on how to care for yourself after your procedure. Your health care provider may also give you more specific instructions. If you have problems or questions, contact your health care provider. What can I expect after the procedure? After the procedure, it is common to have: Trouble swallowing (dysphagia). Discomfort when you swallow. Soreness in your abdomen. Bloating. Follow these instructions at home: Medicines Take over-the-counter and prescription medicines only as told by your health care provider. Ask your health care provider or pharmacist if you can crush any pill that you are taking. Take only liquid medicines as told. Ask your health care provider if the medicine prescribed to you requires you to avoid driving or using machinery. Incision care  Follow instructions from your health care provider about how to take care of your incisions. Make sure you: Wash your hands with soap and water for at least 20 seconds before and after you change your bandage (dressing). If soap and water are not available, use hand sanitizer. Change your dressing as told by your health care provider. Leave stitches (sutures), skin glue, or adhesive strips in place. These skin closures may need to stay in place for 2 weeks or longer. If adhesive strip edges start to loosen and curl up, you may trim the loose edges. Do not remove adhesive strips completely unless your health care provider tells you to do that. Check your incision area every day for signs of infection. Check for: Redness, swelling, or pain. Fluid or blood. Warmth. Pus or a bad smell. Eating and drinking  Follow instructions from your health care provider about eating or drinking restrictions. Follow these instructions carefully. You may need to follow a  liquid-only diet for 2 weeks, followed by a diet of soft foods for 2 weeks. You should eat slow, take small bites, and chew food carefully. You should eat or drink in an upright position. You should return to your usual diet gradually. Drink enough fluid to keep your urine pale yellow. Activity  If you were given a sedative during the procedure, it can affect you for several hours. Do not drive or operate machinery until your health care provider says that it is safe. Rest as told by your health care provider. Avoid sitting for a long time without moving. Get up to take short walks every 1-2 hours. This is important to improve blood flow and breathing. Do not lift anything that is heavier than 10 lb (4.5 kg), or the limit that you are told, until your health care provider says that it is safe. Avoid activities that take a lot of effort. Ask your health care provider what activities are safe for you. Ask when you can: Return to sexual activity. Drive. Go back to work. Return to your normal activities as told by your health care provider. General instructions Do not take baths, swim, or use a hot tub until your health care provider approves. Ask your health care provider if you may take showers. You may only be allowed to take sponge baths. Do not use any products that contain nicotine or tobacco. These products include cigarettes, chewing tobacco, and vaping devices, such as e-cigarettes. These can delay healing. If you need help quitting, ask your health care provider. Keep all follow-up visits. This is important. Contact a health care provider if: You have chills   or a fever. You have trouble swallowing. You have painful bloating. You have persistent heartburn. You have pain that does not go away with medicine. You have frequent nausea or vomiting. Your incision opens up. You have any of these signs of infection: Redness, swelling, or pain around your incision. Fluid or blood coming  from your incision. Warmth coming from your incision. Pus or a bad smell coming from your incision. A fever. Get help right away if: You have severe pain or severe bloating. You are unable to swallow. You have vomiting that does not stop. You have blood in your vomit. You have trouble breathing. These symptoms may represent a serious problem that is an emergency. Do not wait to see if the symptoms will go away. Get medical help right away. Call your local emergency services (911 in the [place] Do not drive yourself to the hospital. Summary After the procedure, it is common to have trouble swallowing or have discomfort when you swallow. Follow instructions from your health care provider about eating or drinking restrictions. You may need to follow a liquid-only diet for 2 weeks, followed by a diet of soft foods for 2 weeks. Return to your normal activities as told by your health care provider. Keep all follow-up visits as told by your health care provider. This is important. This information is not intended to replace advice given to you by your health care provider. Make sure you discuss any questions you have with your health care provider. Document Revised: 09/25/2019 Document Reviewed: 09/25/2019 Elsevier Patient Education  2023 Elsevier Inc.  

## 2022-07-17 NOTE — Progress Notes (Signed)
62 year old male 3 months out for repair of hiatal hernia and Nissen fundoplication. Overall he is doing well.  He states that his reflux is not controlled 100%.  He is off PPIs and only taking Rolaids as needed.  States he endorses some reflux after eating. Did have episode of diarrhea as well and this is likely related to Rolaids  PE NAD Abd: soft, incision c/d/I, non tender  A/p doing well.  He is referred to significantly better.  Patient states that he is not 100% but he admits that is better.  Do not see evidence of complications at this time.  May return to the office in a couple months or so

## 2022-09-01 ENCOUNTER — Ambulatory Visit: Payer: 59 | Admitting: Family Medicine

## 2022-09-01 ENCOUNTER — Encounter: Payer: Self-pay | Admitting: Family Medicine

## 2022-09-01 VITALS — BP 134/85 | HR 73 | Temp 98.6°F | Ht 67.0 in | Wt 187.6 lb

## 2022-09-01 DIAGNOSIS — K219 Gastro-esophageal reflux disease without esophagitis: Secondary | ICD-10-CM | POA: Diagnosis not present

## 2022-09-01 DIAGNOSIS — Z0000 Encounter for general adult medical examination without abnormal findings: Secondary | ICD-10-CM | POA: Diagnosis not present

## 2022-09-01 DIAGNOSIS — K449 Diaphragmatic hernia without obstruction or gangrene: Secondary | ICD-10-CM

## 2022-09-01 DIAGNOSIS — R030 Elevated blood-pressure reading, without diagnosis of hypertension: Secondary | ICD-10-CM

## 2022-09-01 LAB — URINALYSIS, ROUTINE W REFLEX MICROSCOPIC
Bilirubin, UA: NEGATIVE
Glucose, UA: NEGATIVE
Ketones, UA: NEGATIVE
Leukocytes,UA: NEGATIVE
Nitrite, UA: NEGATIVE
Protein,UA: NEGATIVE
RBC, UA: NEGATIVE
Specific Gravity, UA: <1.005 — ABNORMAL LOW (ref 1.005–1.030)
Urobilinogen, Ur: 0.2 mg/dL (ref 0.2–1.0)
pH, UA: 6 (ref 5.0–7.5)

## 2022-09-01 LAB — MICROALBUMIN, URINE WAIVED
Creatinine, Urine Waived: 10 mg/dL (ref 10–300)
Microalb, Ur Waived: 10 mg/L (ref 0–19)

## 2022-09-01 NOTE — Assessment & Plan Note (Signed)
Doing better after surgery. Continue to monitor. Call with any concerns.

## 2022-09-01 NOTE — Progress Notes (Signed)
BP 134/85   Pulse 73   Temp 98.6 F (37 C) (Oral)   Ht 5\' 7"  (1.702 m)   Wt 187 lb 9.6 oz (85.1 kg)   SpO2 99%   BMI 29.38 kg/m    Subjective:    Patient ID: Jonathan Mayer, male    DOB: 06-26-1960, 62 y.o.   MRN: [id number]  HPI: Jonathan Mayer is a 62 y.o. male presenting on 09/01/2022 for comprehensive medical examination. Current medical complaints include:  Had a nissen fundoplication in January of 2024. He has lost a bunch of weight. Blood pressure and GERD is much better.  Interim Problems from his last visit: no  Depression Screen done today and results listed below:     09/01/2022    8:10 AM 05/08/2022   10:08 AM 02/28/2022    8:17 AM 01/17/2022   10:23 AM 03/20/2017    8:18 AM  Depression screen PHQ 2/9  Decreased Interest 0 0 0 0 0  Down, Depressed, Hopeless 0 0 0 0 0  PHQ - 2 Score 0 0 0 0 0  Altered sleeping 0 0 0 0   Tired, decreased energy 0 0 0 0   Change in appetite 0 0 0 0   Feeling bad or failure about yourself  0 0 0 0   Trouble concentrating 0 0 0 0   Moving slowly or fidgety/restless 0 0 0 0   Suicidal thoughts 0 0 0 0   PHQ-9 Score 0 0 0 0   Difficult doing work/chores Not difficult at all  Not difficult at all Not difficult at all     Past Medical History:  Past Medical History:  Diagnosis Date   Arthritis    Family history of adverse reaction to anesthesia    MOM-BP DROPPED   GERD (gastroesophageal reflux disease)    Headache    MIGRAINES   History of hiatal hernia    Pneumonia    X2 AGE 18 AND IN HIS 30'S    Surgical History:  Past Surgical History:  Procedure Laterality Date   CARPOMETACARPAL (CMC) FUSION OF THUMB Left 10/10/2021   Procedure: CARPOMETACARPAL (CMC) FUSION OF THUMB;  Surgeon: Deeann Saint, MD;  Location: [facility];  Service: Orthopedics;  Laterality: Left;   COLONOSCOPY  2015   COLONOSCOPY WITH PROPOFOL N/A 02/03/2022   Procedure: COLONOSCOPY WITH PROPOFOL;  Surgeon: Midge Minium, MD;  Location: [facility];  Service: Endoscopy;  Laterality: N/A;   ESOPHAGOGASTRODUODENOSCOPY     ESOPHAGOGASTRODUODENOSCOPY (EGD) WITH PROPOFOL N/A 02/03/2022   Procedure: ESOPHAGOGASTRODUODENOSCOPY (EGD) WITH PROPOFOL;  Surgeon: Midge Minium, MD;  Location: [facility];  Service: Endoscopy;  Laterality: N/A;   HEMORRHOID SURGERY N/A 05/25/2018   Procedure: HEMORRHOIDECTOMY;  Surgeon: Duanne Guess, MD;  Location: [facility] OR[facility];  Service: General;  Laterality: N/A;   HERNIA REPAIR Bilateral    20'S   INSERTION OF MESH  04/17/2022   Procedure: INSERTION OF MESH;  Surgeon: Leafy Ro, MD;  Location: [facility] OR[facility];  Service: General;;   KNEE ARTHROSCOPY W/ MENISCAL REPAIR Right    POLYPECTOMY N/A 02/03/2022   Procedure: POLYPECTOMY;  Surgeon: Midge Minium, MD;  Location: [facility];  Service: Endoscopy;  Laterality: N/A;   TONSILLECTOMY AND ADENOIDECTOMY     TRIGGER FINGER RELEASE     x3   XI ROBOTIC ASSISTED PARAESOPHAGEAL HERNIA REPAIR N/A 04/17/2022   Procedure: XI ROBOTIC ASSISTED PARAESOPHAGEAL HERNIA REPAIR, RNFA to assist;  Surgeon: Leafy Ro, MD;  Location: [facility] OR[facility];  Service: General;  Laterality: N/A;    Medications:  Current Outpatient Medications on File Prior to Visit  Medication Sig   fluticasone (FLONASE) 50 MCG/ACT nasal spray Place 1 spray into both nostrils every morning.    esomeprazole (NEXIUM) 20 MG capsule Take 40 mg by mouth at bedtime. (Patient not taking: Reported on 09/01/2022)   No current facility-administered medications on file prior to visit.    Allergies:  Allergies  Allergen Reactions   Aspirin Itching and Other (See Comments)    Eyes swell   Ibuprofen Itching and Other (See Comments)    Eyes swell   Dexilant [Dexlansoprazole] Rash   Terbinafine And Related Hives    Happened with sun exposure, resolved with Benadryl    Social History:  Social History   Socioeconomic History   Marital status: Married    Spouse name: Not on file   Number of  children: Not on file   Years of education: Not on file   Highest education level: Not on file  Occupational History   Not on file  Tobacco Use   Smoking status: Never   Smokeless tobacco: Never  Vaping Use   Vaping Use: Never used  Substance and Sexual Activity   Alcohol use: No   Drug use: No   Sexual activity: Yes  Other Topics Concern   Not on file  Social History Narrative   Not on file   Social Determinants of Health   Financial Resource Strain: Not on file  Food Insecurity: No Food Insecurity (04/17/2022)   Hunger Vital Sign    Worried About Running Out of Food in the Last Year: Never true    Ran Out of Food in the Last Year: Never true  Transportation Needs: No Transportation Needs (04/17/2022)   PRAPARE - Administrator, Civil Service (Medical): No    Lack of Transportation (Non-Medical): No  Physical Activity: Not on file  Stress: Not on file  Social Connections: Not on file  Intimate Partner Violence: Not At Risk (04/17/2022)   Humiliation, Afraid, Rape, and Kick questionnaire    Fear of Current or Ex-Partner: No    Emotionally Abused: No    Physically Abused: No    Sexually Abused: No   Social History   Tobacco Use  Smoking Status Never  Smokeless Tobacco Never   Social History   Substance and Sexual Activity  Alcohol Use No    Family History:  Family History  Problem Relation Age of Onset   Osteoporosis Mother    GER disease Father    Cancer Paternal Uncle    Hypertension Maternal Grandfather     Past medical history, surgical history, medications, allergies, family history and social history reviewed with patient today and changes made to appropriate areas of the chart.   Review of Systems  Constitutional: Negative.   HENT:  Positive for hearing loss and tinnitus. Negative for congestion, ear discharge, ear pain, nosebleeds, sinus pain and sore throat.   Eyes: Negative.   Respiratory: Negative.  Negative for stridor.    Cardiovascular: Negative.   Gastrointestinal:  Positive for heartburn. Negative for abdominal pain, blood in stool, constipation, diarrhea, melena, nausea and vomiting.  Genitourinary: Negative.   Musculoskeletal:  Positive for joint pain and myalgias. Negative for back pain, falls and neck pain.  Skin: Negative.   Neurological: Negative.   Endo/Heme/Allergies:  Positive for environmental allergies. Negative for polydipsia. Does not bruise/bleed easily.  Psychiatric/Behavioral: Negative.  All other ROS negative except what is listed above and in the HPI.      Objective:    BP 134/85   Pulse 73   Temp 98.6 F (37 C) (Oral)   Ht 5\' 7"  (1.702 m)   Wt 187 lb 9.6 oz (85.1 kg)   SpO2 99%   BMI 29.38 kg/m   Wt Readings from Last 3 Encounters:  09/01/22 187 lb 9.6 oz (85.1 kg)  07/16/22 193 lb 9.6 oz (87.8 kg)  05/14/22 200 lb 9.6 oz (91 kg)    Physical Exam Vitals and nursing note reviewed.  Constitutional:      General: He is not in acute distress.    Appearance: Normal appearance. He is not ill-appearing, toxic-appearing or diaphoretic.  HENT:     Head: Normocephalic and atraumatic.     Right Ear: Tympanic membrane, ear canal and external ear normal. There is no impacted cerumen.     Left Ear: Tympanic membrane, ear canal and external ear normal. There is no impacted cerumen.     Nose: Nose normal. No congestion or rhinorrhea.     Mouth/Throat:     Mouth: Mucous membranes are moist.     Pharynx: Oropharynx is clear. No oropharyngeal exudate or posterior oropharyngeal erythema.  Eyes:     General: No scleral icterus.       Right eye: No discharge.        Left eye: No discharge.     Extraocular Movements: Extraocular movements intact.     Conjunctiva/sclera: Conjunctivae normal.     Pupils: Pupils are equal, round, and reactive to light.  Neck:     Vascular: No carotid bruit.  Cardiovascular:     Rate and Rhythm: Normal rate and regular rhythm.     Pulses: Normal  pulses.     Heart sounds: No murmur heard.    No friction rub. No gallop.  Pulmonary:     Effort: Pulmonary effort is normal. No respiratory distress.     Breath sounds: Normal breath sounds. No stridor. No wheezing, rhonchi or rales.  Chest:     Chest wall: No tenderness.  Abdominal:     General: Abdomen is flat. Bowel sounds are normal. There is no distension.     Palpations: Abdomen is soft. There is no mass.     Tenderness: There is no abdominal tenderness. There is no right CVA tenderness, left CVA tenderness, guarding or rebound.     Hernia: No hernia is present.  Genitourinary:    Comments: Genital exam deferred with shared decision making Musculoskeletal:        General: No swelling, tenderness, deformity or signs of injury.     Cervical back: Normal range of motion and neck supple. No rigidity. No muscular tenderness.     Right lower leg: No edema.     Left lower leg: No edema.  Lymphadenopathy:     Cervical: No cervical adenopathy.  Skin:    General: Skin is warm and dry.     Capillary Refill: Capillary refill takes less than 2 seconds.     Coloration: Skin is not jaundiced or pale.     Findings: No bruising, erythema, lesion or rash.  Neurological:     General: No focal deficit present.     Mental Status: He is alert and oriented to person, place, and time.     Cranial Nerves: No cranial nerve deficit.     Sensory: No sensory deficit.     Motor:  No weakness.     Coordination: Coordination normal.     Gait: Gait normal.     Deep Tendon Reflexes: Reflexes normal.  Psychiatric:        Mood and Affect: Mood normal.        Behavior: Behavior normal.        Thought Content: Thought content normal.        Judgment: Judgment normal.     Results for orders placed or performed during the hospital encounter of 04/17/22  HIV Antibody (routine testing w rflx)  Result Value Ref Range   HIV Screen 4th Generation wRfx Non Reactive Non Reactive  CBC  Result Value Ref Range    WBC 14.1 (H) 4.0 - 10.5 K/uL   RBC 5.80 4.22 - 5.81 MIL/uL   Hemoglobin 15.0 13.0 - 17.0 g/dL   HCT 16.1 09.6 - 04.5 %   MCV 80.9 80.0 - 100.0 fL   MCH 25.9 (L) 26.0 - 34.0 pg   MCHC 32.0 30.0 - 36.0 g/dL   RDW 40.9 81.1 - 91.4 %   Platelets 233 150 - 400 K/uL   nRBC 0.0 0.0 - 0.2 %  Creatinine, serum  Result Value Ref Range   Creatinine, Ser 1.09 0.61 - 1.24 mg/dL   GFR, Estimated >78 >29 mL/min  Basic metabolic panel  Result Value Ref Range   Sodium 136 135 - 145 mmol/L   Potassium 4.3 3.5 - 5.1 mmol/L   Chloride 107 98 - 111 mmol/L   CO2 23 22 - 32 mmol/L   Glucose, Bld 116 (H) 70 - 99 mg/dL   BUN 16 8 - 23 mg/dL   Creatinine, Ser 5.62 0.61 - 1.24 mg/dL   Calcium 8.6 (L) 8.9 - 10.3 mg/dL   GFR, Estimated >13 >08 mL/min   Anion gap 6 5 - 15  Magnesium  Result Value Ref Range   Magnesium 1.9 1.7 - 2.4 mg/dL  Surgical pathology  Result Value Ref Range   SURGICAL PATHOLOGY      SURGICAL PATHOLOGY CASE: [id number] PATIENT: Vikash Grenier Surgical Pathology Report     Specimen Submitted: A. Hernia sac  Clinical History: Para esophageal hernia      DIAGNOSIS: A. PARA ESOPHAGEAL HERNIA; REPAIR: - BENIGN HERNIA SAC WITH ADIPOSE TISSUE AND 2 REACTIVE APPEARING LYMPH NODES.   GROSS DESCRIPTION: A. Labeled: Hernia sac Received: Fresh Collection time: 9:49 AM on 04/17/2022 Placed into formalin time: 10:24 AM on 04/17/2022 Tissue fragment(s): 1 Size: 6.7 x 4.1 x 1.5 cm Description: Received is a fragment of yellow lobulated adipose tissue and pink-tan membranous tissue.  Sectioning reveals 2 embedded lymph node candidates, ranging from 0.5 to 0.7 cm in greatest dimension. The lymph node candidates are bisected, differentially inked, and submitted entirely in cassette 1 and representative sections of the adipose tissue and membranous tissue are submitted in cassette 2.  RB 04/17/2022  Final Diagnosis performed by Redmond Pulling, MD.    Electronically  signed 04/18/2022 12:04:40PM The electronic signature indicates that the named Attending Pathologist has evaluated the specimen Technical component performed at [facility], [address] [place], [place] [address] Lab: [phone] Dir: Jolene Schimke, MD, MMM  Professional component performed at [facility], [facility], [address] [place], [place], [place] [address] Lab: [phone] Dir: Beryle Quant, MD       Assessment & Plan:   Problem List Items Addressed This Visit       Respiratory   Hiatal hernia with gastroesophageal reflux    Doing  better after surgery. Continue to monitor. Call with any concerns.       Other Visit Diagnoses     Routine general medical examination at a health care facility    -  Primary   Vaccines up to date. Screening labs checked today. Colonoscopy up to date. Continue diet and exercise. Call with any concerns. Continue to monitor.   Relevant Orders   Comprehensive metabolic panel   CBC with Differential/Platelet   Lipid Panel w/o Chol/HDL Ratio   PSA   TSH   Urinalysis, Routine w reflex microscopic   Elevated BP without diagnosis of hypertension       Under good control today. Will check microalbumin. Call with any concerns.   Relevant Orders   Microalbumin, Urine Waived        LABORATORY TESTING:  Health maintenance labs ordered today as discussed above.   The natural history of prostate cancer and ongoing controversy regarding screening and potential treatment outcomes of prostate cancer has been discussed with the patient. The meaning of a false positive PSA and a false negative PSA has been discussed. He indicates understanding of the limitations of this screening test and wishes to proceed with screening PSA testing.   IMMUNIZATIONS:   - Tdap: Tetanus vaccination status reviewed: last tetanus booster within 10 years. - Influenza: Postponed to flu season - Pneumovax: Not applicable - Prevnar: Not applicable - COVID:  Refused - HPV: Not applicable - Shingrix vaccine: Refused  SCREENING: - Colonoscopy: Up to date  Discussed with patient purpose of the colonoscopy is to detect colon cancer at curable precancerous or early stages   PATIENT COUNSELING:    Sexuality: Discussed sexually transmitted diseases, partner selection, use of condoms, avoidance of unintended pregnancy  and contraceptive alternatives.   Advised to avoid cigarette smoking.  I discussed with the patient that most people either abstain from alcohol or drink within safe limits (<=14/week and <=4 drinks/occasion for males, <=7/weeks and <= 3 drinks/occasion for females) and that the risk for alcohol disorders and other health effects rises proportionally with the number of drinks per week and how often a drinker exceeds daily limits.  Discussed cessation/primary prevention of drug use and availability of treatment for abuse.   Diet: Encouraged to adjust caloric intake to maintain  or achieve ideal body weight, to reduce intake of dietary saturated fat and total fat, to limit sodium intake by avoiding high sodium foods and not adding table salt, and to maintain adequate dietary potassium and calcium preferably from fresh fruits, vegetables, and low-fat dairy products.    stressed the importance of regular exercise  Injury prevention: Discussed safety belts, safety helmets, smoke detector, smoking near bedding or upholstery.   Dental health: Discussed importance of regular tooth brushing, flossing, and dental visits.   Follow up plan: NEXT PREVENTATIVE PHYSICAL DUE IN 1 YEAR. Return in about 1 year (around 09/01/2023) for physical.

## 2022-09-02 LAB — COMPREHENSIVE METABOLIC PANEL
ALT: 35 IU/L (ref 0–44)
AST: 36 IU/L (ref 0–40)
Albumin/Globulin Ratio: 1.8
Albumin: 4.2 g/dL (ref 3.9–4.9)
Alkaline Phosphatase: 130 IU/L — ABNORMAL HIGH (ref 44–121)
BUN/Creatinine Ratio: 13 (ref 10–24)
BUN: 13 mg/dL (ref 8–27)
Bilirubin Total: 0.4 mg/dL (ref 0.0–1.2)
CO2: 21 mmol/L (ref 20–29)
Calcium: 9.5 mg/dL (ref 8.6–10.2)
Chloride: 105 mmol/L (ref 96–106)
Creatinine, Ser: 1 mg/dL (ref 0.76–1.27)
Globulin, Total: 2.4 g/dL (ref 1.5–4.5)
Glucose: 92 mg/dL (ref 70–99)
Potassium: 4.8 mmol/L (ref 3.5–5.2)
Sodium: 139 mmol/L (ref 134–144)
Total Protein: 6.6 g/dL (ref 6.0–8.5)
eGFR: 85 mL/min/{1.73_m2} (ref >59)

## 2022-09-02 LAB — LIPID PANEL W/O CHOL/HDL RATIO
Cholesterol, Total: 148 mg/dL (ref 100–199)
HDL: 41 mg/dL (ref >39)
LDL Chol Calc (NIH): 91 mg/dL (ref 0–99)
Triglycerides: 86 mg/dL (ref 0–149)
VLDL Cholesterol Cal: 16 mg/dL (ref 5–40)

## 2022-09-02 LAB — CBC WITH DIFFERENTIAL/PLATELET
Basophils Absolute: 0 10*3/uL (ref 0.0–0.2)
Basos: 1 %
EOS (ABSOLUTE): 0.1 10*3/uL (ref 0.0–0.4)
Eos: 1 %
Hematocrit: 46.7 % (ref 37.5–51.0)
Hemoglobin: 15.4 g/dL (ref 13.0–17.7)
Immature Grans (Abs): 0 10*3/uL (ref 0.0–0.1)
Immature Granulocytes: 0 %
Lymphocytes Absolute: 1.1 10*3/uL (ref 0.7–3.1)
Lymphs: 20 %
MCH: 27.9 pg (ref 26.6–33.0)
MCHC: 33 g/dL (ref 31.5–35.7)
MCV: 85 fL (ref 79–97)
Monocytes Absolute: 0.4 10*3/uL (ref 0.1–0.9)
Monocytes: 7 %
Neutrophils Absolute: 4 10*3/uL (ref 1.4–7.0)
Neutrophils: 71 %
Platelets: 233 10*3/uL (ref 150–450)
RBC: 5.52 x10E6/uL (ref 4.14–5.80)
RDW: 14.8 % (ref 11.6–15.4)
WBC: 5.5 10*3/uL (ref 3.4–10.8)

## 2022-09-02 LAB — PSA: Prostate Specific Ag, Serum: 1.2 ng/mL (ref 0.0–4.0)

## 2022-09-02 LAB — TSH: TSH: 2 u[IU]/mL (ref 0.450–4.500)

## 2022-09-05 ENCOUNTER — Encounter: Payer: Self-pay | Admitting: Family Medicine

## 2022-09-15 ENCOUNTER — Encounter: Payer: 59 | Admitting: Surgery

## 2022-09-30 ENCOUNTER — Other Ambulatory Visit: Payer: Self-pay

## 2022-09-30 ENCOUNTER — Telehealth: Payer: Self-pay

## 2022-09-30 ENCOUNTER — Telehealth: Payer: Self-pay | Admitting: Gastroenterology

## 2022-09-30 DIAGNOSIS — Z86010 Personal history of colonic polyps: Secondary | ICD-10-CM

## 2022-09-30 MED ORDER — NA SULFATE-K SULFATE-MG SULF 17.5-3.13-1.6 GM/177ML PO SOLN: 1.0000 | Freq: Once | ORAL | 0 refills | Status: AC

## 2022-09-30 NOTE — Progress Notes (Signed)
Gastroenterology Pre-Procedure Review  Request Date: 02/17/23 Requesting Physician: Dr. Servando Snare  PATIENT REVIEW QUESTIONS: The patient responded to the following health history questions as indicated:    1. Are you having any GI issues? no 2. Do you have a personal history of Polyps? yes (last colonoscopy 02/03/22 10 mm polyp noted) 3. Do you have a family history of Colon Cancer or Polyps? no 4. Diabetes Mellitus? no 5. Joint replacements in the past 12 months?no 6. Major health problems in the past 3 months?no 7. Any artificial heart valves, MVP, or defibrillator?no    MEDICATIONS & ALLERGIES:    Patient reports the following regarding taking any anticoagulation/antiplatelet therapy:   Plavix, Coumadin, Eliquis, Xarelto, Lovenox, Pradaxa, Brilinta, or Effient? no Aspirin? no  Patient confirms/reports the following medications:  Current Outpatient Medications  Medication Sig Dispense Refill   Na Sulfate-K Sulfate-Mg Sulf 17.5-3.13-1.6 GM/177ML SOLN Take 1 kit by mouth once for 1 dose. 354 mL 0   esomeprazole (NEXIUM) 20 MG capsule Take 40 mg by mouth at bedtime. (Patient not taking: Reported on 09/01/2022)     fluticasone (FLONASE) 50 MCG/ACT nasal spray Place 1 spray into both nostrils every morning.      No current facility-administered medications for this visit.    Patient confirms/reports the following allergies:  Allergies  Allergen Reactions   Aspirin Itching and Other (See Comments)    Eyes swell   Ibuprofen Itching and Other (See Comments)    Eyes swell   Dexilant [Dexlansoprazole] Rash   Terbinafine And Related Hives    Happened with sun exposure, resolved with Benadryl    Orders Placed This Encounter  Procedures   Procedural/ Surgical Case Request: COLONOSCOPY WITH PROPOFOL    Standing Status:   Standing    Number of Occurrences:   1    Order Specific Question:   Pre-op diagnosis    Answer:   history of colon polyps    Order Specific Question:   CPT Code     Answer:   PR COLONOSCOPY FLX DX W/COLLJ SPEC WHEN PFRMD [45378]   Ambulatory referral to Gastroenterology    Referral Priority:   Routine    Referral Type:   Consultation    Referral Reason:   Specialty Services Required    Referred to Provider:   Midge Minium, MD    Number of Visits Requested:   1    AUTHORIZATION INFORMATION Primary Insurance: 1D#: Group #:  Secondary Insurance: 1D#: Group #:  SCHEDULE INFORMATION: Date: 02/17/23 Time: Location: [facility]

## 2022-09-30 NOTE — Telephone Encounter (Signed)
Patient called in to schedule his colonoscopy. Per Dr. Servando Snare I recommend you have a repeat colonoscopy in 1 year to determine if you have developed any new polyps and to screen for colorectal cancer. The best time to connect the patient before 5:00 pm est.

## 2022-09-30 NOTE — Telephone Encounter (Signed)
Gastroenterology Pre-Procedure Review  Request Date: 02/17/23 Requesting Physician: Dr. Servando Snare  PATIENT REVIEW QUESTIONS: The patient responded to the following health history questions as indicated:    1. Are you having any GI issues? no 2. Do you have a personal history of Polyps? yes (last colonoscopy 02/03/22 with Dr. Servando Snare 10 mm polyp noted) 3. Do you have a family history of Colon Cancer or Polyps? no 4. Diabetes Mellitus? no 5. Joint replacements in the past 12 months?no 6. Major health problems in the past 3 months?no 7. Any artificial heart valves, MVP, or defibrillator?no    MEDICATIONS & ALLERGIES:    Patient reports the following regarding taking any anticoagulation/antiplatelet therapy:   Plavix, Coumadin, Eliquis, Xarelto, Lovenox, Pradaxa, Brilinta, or Effient? no Aspirin? no  Patient confirms/reports the following medications:  Current Outpatient Medications  Medication Sig Dispense Refill   fluticasone (FLONASE) 50 MCG/ACT nasal spray Place 1 spray into both nostrils every morning.      esomeprazole (NEXIUM) 20 MG capsule Take 40 mg by mouth at bedtime. (Patient not taking: Reported on 09/01/2022)     Na Sulfate-K Sulfate-Mg Sulf 17.5-3.13-1.6 GM/177ML SOLN Take 1 kit by mouth once for 1 dose. 354 mL 0   No current facility-administered medications for this visit.    Patient confirms/reports the following allergies:  Allergies  Allergen Reactions   Aspirin Itching and Other (See Comments)    Eyes swell   Ibuprofen Itching and Other (See Comments)    Eyes swell   Dexilant [Dexlansoprazole] Rash   Terbinafine And Related Hives    Happened with sun exposure, resolved with Benadryl    No orders of the defined types were placed in this encounter.   AUTHORIZATION INFORMATION Primary Insurance: 1D#: Group #:  Secondary Insurance: 1D#: Group #:  SCHEDULE INFORMATION: Date: 02/17/23 Time: Location: [facility]

## 2023-02-09 ENCOUNTER — Telehealth: Payer: Self-pay

## 2023-02-09 ENCOUNTER — Telehealth: Payer: Self-pay | Admitting: Gastroenterology

## 2023-02-09 NOTE — Telephone Encounter (Signed)
Pt is requesting an EGD be added

## 2023-02-09 NOTE — Telephone Encounter (Signed)
Patient called in for advice about his GERD. He has colonoscopy with Dr. Servando Snare on 02/17/23. He stated that he use to take (Nexium). He has token a few of different medication over the counter.

## 2023-02-09 NOTE — Telephone Encounter (Signed)
Pt had a swallowing  test that showed some more acid reflux.

## 2023-02-09 NOTE — Telephone Encounter (Signed)
Per pt he is having some acid reflux. Pt would like to know if he could  get EDG when he has the colonoscopy. Pt also wants to talk to someone before adding the EGD.

## 2023-02-10 NOTE — Telephone Encounter (Signed)
I spoke to pt and he is aware of what Dr Servando Snare said and EGD has been added, nothing further needed

## 2023-02-10 NOTE — Telephone Encounter (Signed)
Pt called today stating he is upset he hasn't received a call back from Dr. Annabell Sabal CMA. He has his colonoscopy next week and wanted to know if Servando Snare could do the EGD. Advised pt, Dr. Servando Snare ok for him to add the EGD and he can restart the Nexium. Pt would like to see Dr. Servando Snare after his procedures to discuss his problems. Please contact pt and schedule office appointment as soon as you can.

## 2023-02-16 ENCOUNTER — Encounter: Payer: Self-pay | Admitting: Gastroenterology

## 2023-02-17 ENCOUNTER — Encounter: Payer: Self-pay | Admitting: Gastroenterology

## 2023-02-17 ENCOUNTER — Ambulatory Visit: Payer: 59 | Admitting: Registered Nurse

## 2023-02-17 ENCOUNTER — Other Ambulatory Visit: Payer: Self-pay

## 2023-02-17 ENCOUNTER — Ambulatory Visit
Admission: RE | Admit: 2023-02-17 | Discharge: 2023-02-17 | Disposition: A | Discharge status: Home / Self Care | Payer: 59 | Attending: Gastroenterology | Admitting: Gastroenterology

## 2023-02-17 ENCOUNTER — Encounter
Admission: RE | Disposition: A | Discharge status: Home / Self Care | Payer: Self-pay | Source: Home / Self Care | Attending: Gastroenterology

## 2023-02-17 DIAGNOSIS — Z9889 Other specified postprocedural states: Secondary | ICD-10-CM | POA: Insufficient documentation

## 2023-02-17 DIAGNOSIS — K5730 Diverticulosis of large intestine without perforation or abscess without bleeding: Secondary | ICD-10-CM | POA: Insufficient documentation

## 2023-02-17 DIAGNOSIS — K635 Polyp of colon: Secondary | ICD-10-CM | POA: Insufficient documentation

## 2023-02-17 DIAGNOSIS — Z1211 Encounter for screening for malignant neoplasm of colon: Secondary | ICD-10-CM

## 2023-02-17 DIAGNOSIS — K640 First degree hemorrhoids: Secondary | ICD-10-CM | POA: Diagnosis not present

## 2023-02-17 DIAGNOSIS — K219 Gastro-esophageal reflux disease without esophagitis: Secondary | ICD-10-CM | POA: Insufficient documentation

## 2023-02-17 DIAGNOSIS — Z860100 Personal history of colon polyps, unspecified: Secondary | ICD-10-CM

## 2023-02-17 HISTORY — PX: ESOPHAGOGASTRODUODENOSCOPY: SHX5428

## 2023-02-17 HISTORY — PX: COLONOSCOPY WITH PROPOFOL: SHX5780

## 2023-02-17 SURGERY — COLONOSCOPY WITH PROPOFOL
Anesthesia: General

## 2023-02-17 MED ORDER — LIDOCAINE HCL (CARDIAC) PF 100 MG/5ML IV SOSY
PREFILLED_SYRINGE | INTRAVENOUS | Status: DC | PRN
  Administered 2023-02-17: 40 mg via INTRAVENOUS

## 2023-02-17 MED ORDER — LIDOCAINE HCL (PF) 2 % IJ SOLN
INTRAMUSCULAR | Status: AC
  Filled 2023-02-17: qty 5

## 2023-02-17 MED ORDER — GLYCOPYRROLATE 0.2 MG/ML IJ SOLN
INTRAMUSCULAR | Status: AC
Start: 2023-02-17 — End: ?
  Filled 2023-02-17: qty 1

## 2023-02-17 MED ORDER — GLYCOPYRROLATE 0.2 MG/ML IJ SOLN
INTRAMUSCULAR | Status: DC | PRN
  Administered 2023-02-17: .2 mg via INTRAVENOUS

## 2023-02-17 MED ORDER — PROPOFOL 10 MG/ML IV BOLUS
INTRAVENOUS | Status: AC
  Filled 2023-02-17: qty 20

## 2023-02-17 MED ORDER — SODIUM CHLORIDE 0.9 % IV SOLN: INTRAVENOUS | Status: DC

## 2023-02-17 MED ORDER — PROPOFOL 500 MG/50ML IV EMUL
INTRAVENOUS | Status: DC | PRN
Start: 2023-02-17 — End: 2023-02-17
  Administered 2023-02-17: 150 ug/kg/min via INTRAVENOUS

## 2023-02-17 MED ORDER — PROPOFOL 10 MG/ML IV BOLUS
INTRAVENOUS | Status: DC | PRN
  Administered 2023-02-17: 50 mg via INTRAVENOUS

## 2023-02-17 NOTE — H&P (Signed)
Midge Minium, MD [facility] [address], [address] [place], [place] [address] Phone:[phone] Fax : [phone]  Primary Care Physician:  Dorcas Carrow, DO Primary Gastroenterologist:  Dr. Servando Snare  Pre-Procedure History & Physical: HPI:  Jonathan Mayer is a 62 y.o. male is here for an endoscopy and colonoscopy.   Past Medical History:  Diagnosis Date   Arthritis    Family history of adverse reaction to anesthesia    MOM-BP DROPPED   GERD (gastroesophageal reflux disease)    Headache    MIGRAINES   History of hiatal hernia    Pneumonia    X2 AGE 13 AND IN HIS 30'S    Past Surgical History:  Procedure Laterality Date   CARPOMETACARPAL ([facility]) FUSION OF THUMB Left 10/10/2021   Procedure: CARPOMETACARPAL [facility]) FUSION OF THUMB;  Surgeon: Deeann Saint, MD;  Location: [facility] ORS;  Service: Orthopedics;  Laterality: Left;   COLONOSCOPY  2015   COLONOSCOPY WITH PROPOFOL N/A 02/03/2022   Procedure: COLONOSCOPY WITH PROPOFOL;  Surgeon: Midge Minium, MD;  Location: [facility];  Service: Endoscopy;  Laterality: N/A;   ESOPHAGOGASTRODUODENOSCOPY     ESOPHAGOGASTRODUODENOSCOPY (EGD) WITH PROPOFOL N/A 02/03/2022   Procedure: ESOPHAGOGASTRODUODENOSCOPY (EGD) WITH PROPOFOL;  Surgeon: Midge Minium, MD;  Location: [facility];  Service: Endoscopy;  Laterality: N/A;   HEMORRHOID SURGERY N/A 05/25/2018   Procedure: HEMORRHOIDECTOMY;  Surgeon: Duanne Guess, MD;  Location: [facility] ORS;  Service: General;  Laterality: N/A;   HERNIA REPAIR Bilateral    20'S   INSERTION OF MESH  04/17/2022   Procedure: INSERTION OF MESH;  Surgeon: Leafy Ro, MD;  Location: [facility] ORS;  Service: General;;   KNEE ARTHROSCOPY W/ MENISCAL REPAIR Right    POLYPECTOMY N/A 02/03/2022   Procedure: POLYPECTOMY;  Surgeon: Midge Minium, MD;  Location: [facility];  Service: Endoscopy;  Laterality: N/A;   TONSILLECTOMY AND ADENOIDECTOMY     TRIGGER FINGER RELEASE     x3   XI ROBOTIC ASSISTED  PARAESOPHAGEAL HERNIA REPAIR N/A 04/17/2022   Procedure: XI ROBOTIC ASSISTED PARAESOPHAGEAL HERNIA REPAIR, RNFA to assist;  Surgeon: Leafy Ro, MD;  Location: [facility] ORS;  Service: General;  Laterality: N/A;    Prior to Admission medications   Medication Sig Start Date End Date Taking? Authorizing Provider  fluticasone (FLONASE) 50 MCG/ACT nasal spray Place 1 spray into both nostrils every morning.    Yes [provider]  esomeprazole (NEXIUM) 20 MG capsule Take 40 mg by mouth at bedtime. Patient not taking: Reported on 09/01/2022    [provider]    Allergies as of 10/01/2022 - Review Complete 09/30/2022  Allergen Reaction Noted   Aspirin Itching and Other (See Comments) 11/18/2013   Ibuprofen Itching and Other (See Comments) 11/18/2013   Dexilant [dexlansoprazole] Rash 04/08/2022   Terbinafine and related Hives 01/16/2021    Family History  Problem Relation Age of Onset   Osteoporosis Mother    GER disease Father    Cancer Paternal Uncle    Hypertension Maternal Grandfather     Social History   Socioeconomic History   Marital status: Married    Spouse name: Not on file   Number of children: Not on file   Years of education: Not on file   Highest education level: Not on file  Occupational History   Not on file  Tobacco Use   Smoking status: Never   Smokeless tobacco: Never  Vaping Use   Vaping status: Never Used  Substance and Sexual  Activity   Alcohol use: No   Drug use: No   Sexual activity: Yes  Other Topics Concern   Not on file  Social History Narrative   Not on file   Social Determinants of Health   Financial Resource Strain: Not on file  Food Insecurity: No Food Insecurity (04/17/2022)   Hunger Vital Sign    Worried About Running Out of Food in the Last Year: Never true    Ran Out of Food in the Last Year: Never true  Transportation Needs: No Transportation Needs (04/17/2022)   PRAPARE - Administrator, Civil Service  (Medical): No    Lack of Transportation (Non-Medical): No  Physical Activity: Not on file  Stress: Not on file  Social Connections: Not on file  Intimate Partner Violence: Not At Risk (04/17/2022)   Humiliation, Afraid, Rape, and Kick questionnaire    Fear of Current or Ex-Partner: No    Emotionally Abused: No    Physically Abused: No    Sexually Abused: No    Review of Systems: See HPI, otherwise negative ROS  Physical Exam: BP 134/89   Pulse 72   Temp 97.6 F (36.4 C) (Temporal)   Resp 16   Ht 5\' 7"  (1.702 m)   Wt 85.2 kg   SpO2 100%   BMI 29.41 kg/m  General:   Alert,  pleasant and cooperative in NAD Head:  Normocephalic and atraumatic. Neck:  Supple; no masses or thyromegaly. Lungs:  Clear throughout to auscultation.    Heart:  Regular rate and rhythm. Abdomen:  Soft, nontender and nondistended. Normal bowel sounds, without guarding, and without rebound.   Neurologic:  Alert and  oriented x4;  grossly normal neurologically.  Impression/Plan: Jonathan Mayer is here for an endoscopy and colonoscopy to be performed for GERD and a history of adenomatous polyps on 2023   Risks, benefits, limitations, and alternatives regarding  endoscopy and colonoscopy have been reviewed with the patient.  Questions have been answered.  All parties agreeable.   Midge Minium, MD  02/17/2023, 9:25 AM

## 2023-02-17 NOTE — Anesthesia Preprocedure Evaluation (Signed)
Anesthesia Evaluation  Patient identified by MRN, date of birth, ID band Patient awake    Reviewed: Allergy & Precautions, NPO status , Patient's Chart, lab work & pertinent test results  History of Anesthesia Complications Negative for: history of anesthetic complications  Airway Mallampati: III  TM Distance: <3 FB Neck ROM: full    Dental  (+) Chipped, Dental Advidsory Given   Pulmonary neg pulmonary ROS, neg shortness of breath, neg COPD, neg recent URI   Pulmonary exam normal        Cardiovascular Exercise Tolerance: Good (-) angina (-) Past MI negative cardio ROS Normal cardiovascular exam     Neuro/Psych  Headaches, neg Seizures  negative psych ROS   GI/Hepatic Neg liver ROS, hiatal hernia,GERD  Controlled,,  Endo/Other  negative endocrine ROSneg diabetes    Renal/GU      Musculoskeletal   Abdominal   Peds  Hematology negative hematology ROS (+)   Anesthesia Other Findings Past Medical History: No date: Arthritis No date: Family history of adverse reaction to anesthesia     Comment:  MOM-BP DROPPED No date: GERD (gastroesophageal reflux disease) No date: Headache     Comment:  MIGRAINES No date: History of hiatal hernia No date: Pneumonia     Comment:  X2 AGE 60 AND IN HIS 30'S  Past Surgical History: 10/10/2021: CARPOMETACARPAL ([facility]) FUSION OF THUMB; Left     Comment:  Procedure: CARPOMETACARPAL ([facility]) FUSION OF THUMB;                Surgeon: Deeann Saint, MD;  Location: [facility] ORS;                Service: Orthopedics;  Laterality: Left; 2015: COLONOSCOPY 02/03/2022: COLONOSCOPY WITH PROPOFOL; N/A     Comment:  Procedure: COLONOSCOPY WITH PROPOFOL;  Surgeon: Midge Minium, MD;  Location: [facility];  Service:               Endoscopy;  Laterality: N/A; No date: ESOPHAGOGASTRODUODENOSCOPY 02/03/2022: ESOPHAGOGASTRODUODENOSCOPY (EGD) WITH PROPOFOL; N/A     Comment:  Procedure:  ESOPHAGOGASTRODUODENOSCOPY (EGD) WITH               PROPOFOL;  Surgeon: Midge Minium, MD;  Location: [facility];  Service: Endoscopy;  Laterality: N/A; 05/25/2018: HEMORRHOID SURGERY; N/A     Comment:  Procedure: HEMORRHOIDECTOMY;  Surgeon: Duanne Guess,              MD;  Location: [facility] ORS;  Service: General;  Laterality:               N/A; No date: HERNIA REPAIR; Bilateral     Comment:  20'S No date: KNEE ARTHROSCOPY W/ MENISCAL REPAIR; Right 02/03/2022: POLYPECTOMY; N/A     Comment:  Procedure: POLYPECTOMY;  Surgeon: Midge Minium, MD;                Location: [facility];  Service: Endoscopy;                Laterality: N/A; No date: TONSILLECTOMY AND ADENOIDECTOMY No date: TRIGGER FINGER RELEASE     Comment:  x3  BMI    Body Mass Index: 32.91 kg/m      Reproductive/Obstetrics negative OB ROS  Anesthesia Physical Anesthesia Plan  ASA: 2  Anesthesia Plan: General   Post-op Pain Management:    Induction: Intravenous  PONV Risk Score and Plan: 2 and Treatment may vary due to age or medical condition, Propofol infusion and TIVA  Airway Management Planned: Natural Airway and Nasal Cannula  Additional Equipment:   Intra-op Plan:   Post-operative Plan:   Informed Consent: I have reviewed the patients History and Physical, chart, labs and discussed the procedure including the risks, benefits and alternatives for the proposed anesthesia with the patient or authorized representative who has indicated his/her understanding and acceptance.     Dental Advisory Given  Plan Discussed with: Anesthesiologist, CRNA and Surgeon  Anesthesia Plan Comments: (Patient consented for risks of anesthesia including but not limited to:  - adverse reactions to medications - damage to eyes, teeth, lips or other oral mucosa - nerve damage due to positioning  - sore throat or hoarseness - Damage to  heart, brain, nerves, lungs, other parts of body or loss of life  Patient voiced understanding.)       Anesthesia Quick Evaluation

## 2023-02-17 NOTE — Op Note (Signed)
[facility] Gastroenterology Patient Name: Jonathan Mayer Procedure Date: 02/17/2023 9:54 AM MRN: [id number] Account #: [id number] Date of Birth: Jul 15, 1960 Admit Type: Outpatient Age: 62 Room: [facility] ENDO ROOM 4 Gender: Male Note Status: Finalized Instrument Name: Prentice Docker [id number] Procedure:             Colonoscopy Indications:           High risk colon cancer surveillance: Personal history                         of colonic polyps Providers:             Midge Minium MD, MD Medicines:             Propofol per Anesthesia Complications:         No immediate complications. Procedure:             Pre-Anesthesia Assessment:                        - Prior to the procedure, a History and Physical was                         performed, and patient medications and allergies were                         reviewed. The patient's tolerance of previous                         anesthesia was also reviewed. The risks and benefits                         of the procedure and the sedation options and risks                         were discussed with the patient. All questions were                         answered, and informed consent was obtained. Prior                         Anticoagulants: The patient has taken no anticoagulant                         or antiplatelet agents. ASA Grade Assessment: II - A                         patient with mild systemic disease. After reviewing                         the risks and benefits, the patient was deemed in                         satisfactory condition to undergo the procedure.                        After obtaining informed consent, the colonoscope was                         passed under direct vision. Throughout  the procedure,                         the patient's blood pressure, pulse, and oxygen                         saturations were monitored continuously. The                         Colonoscope was introduced through the  anus and                         advanced to the the cecum, identified by appendiceal                         orifice and ileocecal valve. The colonoscopy was                         performed without difficulty. The patient tolerated                         the procedure well. The quality of the bowel                         preparation was excellent. Findings:      The perianal and digital rectal examinations were normal.      A 4 mm polyp was found in the transverse colon. The polyp was sessile.       The polyp was removed with a cold snare. Resection and retrieval were       complete.      Non-bleeding internal hemorrhoids were found during retroflexion. The       hemorrhoids were Grade I (internal hemorrhoids that do not prolapse).      A few small-mouthed diverticula were found in the entire colon. Impression:            - One 4 mm polyp in the transverse colon, removed with                         a cold snare. Resected and retrieved.                        - Non-bleeding internal hemorrhoids.                        - Diverticulosis in the entire examined colon. Recommendation:        - Discharge patient to home.                        - Resume previous diet.                        - Continue present medications.                        - Await pathology results.                        - Repeat colonoscopy in 5 years for surveillance. Procedure Code(s):     --- Professional ---  40102, Colonoscopy, flexible; with removal of                         tumor(s), polyp(s), or other lesion(s) by snare                         technique Diagnosis Code(s):     --- Professional ---                        Z86.010, Personal history of colonic polyps                        D12.3, Benign neoplasm of transverse colon (hepatic                         flexure or splenic flexure) CPT copyright 2022 American Medical Association. All rights reserved. The codes documented in  this report are preliminary and upon coder review may  be revised to meet current compliance requirements. Midge Minium MD, MD 02/17/2023 10:34:36 AM This report has been signed electronically. Number of Addenda: 0 Note Initiated On: 02/17/2023 9:54 AM Scope Withdrawal Time: 0 hours 8 minutes 19 seconds  Total Procedure Duration: 0 hours 13 minutes 51 seconds  Estimated Blood Loss:  Estimated blood loss: none.      [facility]

## 2023-02-17 NOTE — Op Note (Signed)
[facility] Gastroenterology Patient Name: Jonathan Mayer Procedure Date: 02/17/2023 9:54 AM MRN: [id number] Account #: [id number] Date of Birth: [birth year] Admit Type: Outpatient Age: 62 Room: [facility] ENDO ROOM 4 Gender: Male Note Status: Finalized Instrument Name: Patton Salles Endoscope [id number] Procedure:             Upper GI endoscopy Providers:             Midge Minium MD, MD Medicines:             Propofol per Anesthesia Complications:         No immediate complications. Procedure:             Pre-Anesthesia Assessment:                        - Prior to the procedure, a History and Physical was                         performed, and patient medications and allergies were                         reviewed. The patient's tolerance of previous                         anesthesia was also reviewed. The risks and benefits                         of the procedure and the sedation options and risks                         were discussed with the patient. All questions were                         answered, and informed consent was obtained. Prior                         Anticoagulants: The patient has taken no anticoagulant                         or antiplatelet agents. ASA Grade Assessment: II - A                         patient with mild systemic disease. After reviewing                         the risks and benefits, the patient was deemed in                         satisfactory condition to undergo the procedure.                        After obtaining informed consent, the endoscope was                         passed under direct vision. Throughout the procedure,                         the patient's blood pressure, pulse, and oxygen  saturations were monitored continuously. The Endoscope                         was introduced through the mouth, and advanced to the                         second part of duodenum. The upper GI endoscopy was                          accomplished without difficulty. The patient tolerated                         the procedure well. 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      The stomach was normal.      The examined duodenum was normal. Impression:            - Nissen fundoplication was found.                        - Normal stomach.                        - Normal examined duodenum.                        - No specimens collected. Recommendation:        - Discharge patient to home.                        - Resume previous diet.                        - Continue present medications. Procedure Code(s):     --- Professional ---                        [phone], Esophagogastroduodenoscopy, flexible,                         transoral; diagnostic, including collection of                         specimen(s) by brushing or washing, when performed                         (separate procedure) Diagnosis Code(s):     --- Professional ---                        U04.540, Other specified postprocedural states CPT copyright 2022 American Medical Association. All rights reserved. The codes documented in this report are preliminary and upon coder review may  be revised to meet current compliance requirements. Midge Minium MD, MD 02/17/2023 10:16:44 AM This report has been signed electronically. Number of Addenda: 0 Note Initiated On: 02/17/2023 9:54 AM Estimated Blood Loss:  Estimated blood loss: none.      [facility]

## 2023-02-17 NOTE — Transfer of Care (Signed)
Immediate Anesthesia Transfer of Care Note  Patient: Jonathan Mayer  Procedure(s) Performed: COLONOSCOPY WITH PROPOFOL ESOPHAGOGASTRODUODENOSCOPY (EGD)  Patient Location: PACU  Anesthesia Type:General  Level of Consciousness: drowsy and patient cooperative  Airway & Oxygen Therapy: Patient Spontanous Breathing  Post-op Assessment: Report given to RN and Post -op Vital signs reviewed and stable  Post vital signs: stable  Last Vitals:  Vitals Value Taken Time  BP 100/71 02/17/23 1036  Temp 36.1 C 02/17/23 1035  Pulse 83 02/17/23 1037  Resp 18 02/17/23 1035  SpO2 95 % 02/17/23 1037  Vitals shown include unfiled device data.  Last Pain:  Vitals:   02/17/23 1035  TempSrc: Temporal  PainSc: Asleep         Complications: No notable events documented.

## 2023-02-20 LAB — SURGICAL PATHOLOGY

## 2023-02-21 ENCOUNTER — Encounter: Payer: Self-pay | Admitting: Gastroenterology

## 2023-02-23 NOTE — Anesthesia Postprocedure Evaluation (Signed)
Anesthesia Post Note  Patient: Jonathan Mayer  Procedure(s) Performed: COLONOSCOPY WITH PROPOFOL ESOPHAGOGASTRODUODENOSCOPY (EGD)  Patient location during evaluation: Endoscopy Anesthesia Type: General Level of consciousness: awake and alert Pain management: pain level controlled Vital Signs Assessment: post-procedure vital signs reviewed and stable Respiratory status: spontaneous breathing, nonlabored ventilation, respiratory function stable and patient connected to nasal cannula oxygen Cardiovascular status: blood pressure returned to baseline and stable Postop Assessment: no apparent nausea or vomiting Anesthetic complications: no   No notable events documented.   Last Vitals:  Vitals:   02/17/23 1045 02/17/23 1055  BP:    Pulse: 83 74  Resp: 18 18  Temp:    SpO2: 100% 100%    Last Pain:  Vitals:   02/17/23 1055  TempSrc:   PainSc: 0-No pain                 Lenard Simmer

## 2023-03-03 ENCOUNTER — Ambulatory Visit: Payer: Self-pay

## 2023-03-03 NOTE — Telephone Encounter (Signed)
  Chief Complaint: Cough Symptoms: Cough with rattle deep in chest Frequency: Today Pertinent Negatives: Patient denies fever Disposition: [] ED /[] Urgent Care (no appt availability in office) / [x] Appointment(In office/virtual)/ []  [facility]/ [] Home Care/ [] Refused Recommended Disposition /[] [facility]/ []  Follow-up with PCP Additional Notes: Pt has a stuffy nose and started cough today. Pt states there is a rattling down in his chest with deep breath. Pt would like to be seen as he is around older and immunocompromised  family members and an does not want to get them sick. No appts at [facility] - appt for tomorrow morning at [facility] scheduled.  Reason for Disposition  Cough  Answer Assessment - Initial Assessment Questions 1. ONSET: "When did the cough begin?"      This morning 2. SEVERITY: "How bad is the cough today?"      Rattle deep in chest 3. SPUTUM: "Describe the color of your sputum" (none, dry cough; clear, white, yellow, green)     1x 4. HEMOPTYSIS: "Are you coughing up any blood?" If so ask: "How much?" (flecks, streaks, tablespoons, etc.)     no 5. DIFFICULTY BREATHING: "Are you having difficulty breathing?" If Yes, ask: "How bad is it?" (e.g., mild, moderate, severe)    - MILD: No SOB at rest, mild SOB with walking, speaks normally in sentences, can lie down, no retractions, pulse < 100.    - MODERATE: SOB at rest, SOB with minimal exertion and prefers to sit, cannot lie down flat, speaks in phrases, mild retractions, audible wheezing, pulse 100-120.    - SEVERE: Very SOB at rest, speaks in single words, struggling to breathe, sitting hunched forward, retractions, pulse > 120      None to mild 6. FEVER: "Do you have a fever?" If Yes, ask: "What is your temperature, how was it measured, and when did it start?"     no 10. OTHER SYMPTOMS: "Do you have any other symptoms?" (e.g., runny nose, wheezing, chest pain)       Stuffy nose  Protocols used:  Cough - Acute Non-Productive-A-AH

## 2023-03-04 ENCOUNTER — Ambulatory Visit (INDEPENDENT_AMBULATORY_CARE_PROVIDER_SITE_OTHER): Payer: 59 | Admitting: Physician Assistant

## 2023-03-04 ENCOUNTER — Encounter: Payer: Self-pay | Admitting: Physician Assistant

## 2023-03-04 VITALS — BP 130/74 | HR 87 | Resp 16 | Ht 67.0 in | Wt 187.0 lb

## 2023-03-04 DIAGNOSIS — R0989 Other specified symptoms and signs involving the circulatory and respiratory systems: Secondary | ICD-10-CM

## 2023-03-04 DIAGNOSIS — J9801 Acute bronchospasm: Secondary | ICD-10-CM | POA: Diagnosis not present

## 2023-03-04 MED ORDER — PREDNISONE 20 MG PO TABS
40.0000 mg | ORAL_TABLET | Freq: Every day | ORAL | 0 refills | Status: AC
Start: 2023-03-04 — End: 2023-03-09

## 2023-03-04 MED ORDER — AZITHROMYCIN 250 MG PO TABS: ORAL_TABLET | ORAL | 0 refills | Status: DC

## 2023-03-04 NOTE — Progress Notes (Signed)
Acute Office Visit   Patient: Jonathan Mayer   DOB: [birth year]   62 y.o. Male  MRN: [id number] Visit Date: 03/04/2023  Today's healthcare provider: Oswaldo Conroy Thang Flett, PA-C  Introduced myself to the patient as a Secondary school teacher and provided education on APPs in clinical practice.    Chief Complaint  Patient presents with   Cough    x3-4 days. Non-productive.    Subjective    HPI HPI     Cough    Additional comments: x3-4 days. Non-productive.       Last edited by Dollene Primrose, CMA on 03/04/2023 10:24 AM.      URI -type symptoms   Onset: gradual  Duration: reports he is recovering from sinus issues about a week ago  Associated symptoms: having a dry cough that is lingering  Breathing in and out with more force sometimes causes wheezing sounds and can sometimes trigger cough   Interventions; When he had sinus symptoms he took Nyquil to help with nighttime symptoms    Medications: Outpatient Medications Prior to Visit  Medication Sig   fluticasone (FLONASE) 50 MCG/ACT nasal spray Place 1 spray into both nostrils every morning.    Multiple Vitamin (MULTIVITAMIN) tablet Take 1 tablet by mouth daily.   esomeprazole (NEXIUM) 20 MG capsule Take 40 mg by mouth at bedtime. (Patient not taking: Reported on 09/01/2022)   No facility-administered medications prior to visit.    Review of Systems  Constitutional:  Negative for chills, diaphoresis, fatigue and fever.  HENT:  Negative for congestion, ear pain, postnasal drip, rhinorrhea, sinus pressure, sinus pain and sore throat.   Respiratory:  Positive for cough. Negative for shortness of breath and wheezing.   Musculoskeletal:  Negative for myalgias.  Neurological:  Negative for dizziness, light-headedness and headaches.        Objective    BP 130/74   Pulse 87   Resp 16   Ht 5\' 7"  (1.702 m)   Wt 187 lb (84.8 kg)   SpO2 94%   BMI 29.29 kg/m     Physical Exam Vitals reviewed.  Constitutional:      General: He  is awake.     Appearance: Normal appearance. He is well-developed and well-groomed.  HENT:     Head: Normocephalic and atraumatic.  Cardiovascular:     Rate and Rhythm: Normal rate and regular rhythm.     Heart sounds: Normal heart sounds. No murmur heard.    No friction rub. No gallop.  Pulmonary:     Effort: Pulmonary effort is normal.     Breath sounds: No decreased air movement. Rhonchi present. No decreased breath sounds, wheezing or rales.  Musculoskeletal:     Cervical back: Normal range of motion and neck supple.  Skin:    General: Skin is warm and dry.  Neurological:     General: No focal deficit present.     Mental Status: He is alert and oriented to person, place, and time.  Psychiatric:        Mood and Affect: Mood normal.        Behavior: Behavior normal. Behavior is cooperative.        Thought Content: Thought content normal.        Judgment: Judgment normal.       No results found for any visits on 03/04/23.  Assessment & Plan      No follow-ups on file.       Problem  List Items Addressed This Visit   None Visit Diagnoses     Cough due to bronchospasm    -  Primary   Relevant Medications   predniSONE (DELTASONE) 20 MG tablet   azithromycin (ZITHROMAX) 250 MG tablet   Rhonchi       Relevant Medications   azithromycin (ZITHROMAX) 250 MG tablet      Acute, new concern Reports ongoing dry cough that is lingering after recent sinus infection Physical exam is notable for mild rhonchi globally and o2 is mildly decreased at 94%. Suspect this is largely from bronchospasm but cannot fully rule out bronchitis or CAP today Will send in script for Prednisone 40 mg PO every day x 5 day burst along with Zpack for inflammation and abx coverage Reviewed ED and return precautions. Follow up as needed for persistent or progressing symptoms    No follow-ups on file.   I, Elfrieda Espino E Mylynn Dinh, PA-C, have reviewed all documentation for this visit. The documentation on  03/04/23 for the exam, diagnosis, procedures, and orders are all accurate and complete.   Jacquelin Hawking, MHS, PA-C [facility]

## 2023-03-04 NOTE — Patient Instructions (Addendum)
I also recommend adding an antihistamine to your daily regimen This includes medications like Claritin, Allegra, Zyrtec- the generics of these work very well and are usually less expensive I recommend using Flonase nasal spray - 2 puffs twice per day to help with your nasal congestion The antihistamines and Flonase can take a few weeks to provide significant relief from allergy symptoms but should start to provide some benefit soon.  I have sent in a Prednisone burst to help reduce the inflammation in your lungs and thus reduce the coughing  I have sent in a script for Prednisone burst  to be taken in the morning with breakfast per the instructions on the container Remember that steroids can cause sleeplessness, irritability, increased hunger and elevated glucose levels so be mindful of these side effects. They should lessen as you progress to the lower doses of the taper.  I am also sending in a Zpack to help with antibiotic coverage and to further reduce your pulmonary inflammation   If you continue to have coughing and develop shortness of breath, fever, chest pain, productive coughing please call or go to the ED.

## 2023-09-03 ENCOUNTER — Ambulatory Visit (INDEPENDENT_AMBULATORY_CARE_PROVIDER_SITE_OTHER): Payer: Self-pay | Admitting: Family Medicine

## 2023-09-03 ENCOUNTER — Encounter: Payer: Self-pay | Admitting: Family Medicine

## 2023-09-03 VITALS — BP 122/72 | HR 66 | Temp 97.9°F | Wt 191.8 lb

## 2023-09-03 DIAGNOSIS — Z0000 Encounter for general adult medical examination without abnormal findings: Secondary | ICD-10-CM

## 2023-09-03 MED ORDER — FLUTICASONE PROPIONATE 50 MCG/ACT NA SUSP: 2.0000 | Freq: Every day | NASAL | 12 refills | Status: AC

## 2023-09-03 NOTE — Progress Notes (Signed)
 BP 122/72   Pulse 66   Temp 97.9 F (36.6 C) (Oral)   Wt 191 lb 12.8 oz (87 kg)   SpO2 96%   BMI 30.04 kg/m    Subjective:    Patient ID: Jonathan Mayer, male    DOB: 1960-11-28, 63 y.o.   MRN: [id number]  HPI: Jonathan Mayer is a 63 y.o. male presenting on 09/03/2023 for comprehensive medical examination. Current medical complaints include:none  Interim Problems from his last visit: no  Depression Screen done today and results listed below:     09/03/2023    8:01 AM 09/01/2022    8:10 AM 05/08/2022   10:08 AM 02/28/2022    8:17 AM 01/17/2022   10:23 AM  Depression screen PHQ 2/9  Decreased Interest 0 0 0 0 0  Down, Depressed, Hopeless 0 0 0 0 0  PHQ - 2 Score 0 0 0 0 0  Altered sleeping 0 0 0 0 0  Tired, decreased energy 0 0 0 0 0  Change in appetite 0 0 0 0 0  Feeling bad or failure about yourself  0 0 0 0 0  Trouble concentrating 0 0 0 0 0  Moving slowly or fidgety/restless 0 0 0 0 0  Suicidal thoughts 0 0 0 0 0  PHQ-9 Score 0 0 0 0 0  Difficult doing work/chores  Not difficult at all  Not difficult at all Not difficult at all    Past Medical History:  Past Medical History:  Diagnosis Date   Arthritis    Family history of adverse reaction to anesthesia    MOM-BP DROPPED   GERD (gastroesophageal reflux disease)    Headache    MIGRAINES   History of hiatal hernia    Pneumonia    X2 AGE 3 AND IN HIS 30'S    Surgical History:  Past Surgical History:  Procedure Laterality Date   CARPOMETACARPAL (CMC) FUSION OF THUMB Left 10/10/2021   Procedure: CARPOMETACARPAL (CMC) FUSION OF THUMB;  Surgeon: Marlynn Singer, MD;  Location: [facility];  Service: Orthopedics;  Laterality: Left;   COLONOSCOPY  2015   COLONOSCOPY WITH PROPOFOL  N/A 02/03/2022   Procedure: COLONOSCOPY WITH PROPOFOL ;  Surgeon: Marnee Sink, MD;  Location: [facility];  Service: Endoscopy;  Laterality: N/A;   COLONOSCOPY WITH PROPOFOL  N/A 02/17/2023   Procedure: COLONOSCOPY WITH PROPOFOL ;   Surgeon: Marnee Sink, MD;  Location: [facility];  Service: Endoscopy;  Laterality: N/A;   ESOPHAGOGASTRODUODENOSCOPY     ESOPHAGOGASTRODUODENOSCOPY N/A 02/17/2023   Procedure: ESOPHAGOGASTRODUODENOSCOPY (EGD);  Surgeon: Marnee Sink, MD;  Location: [facility];  Service: Endoscopy;  Laterality: N/A;   ESOPHAGOGASTRODUODENOSCOPY (EGD) WITH PROPOFOL  N/A 02/03/2022   Procedure: ESOPHAGOGASTRODUODENOSCOPY (EGD) WITH PROPOFOL ;  Surgeon: Marnee Sink, MD;  Location: [facility];  Service: Endoscopy;  Laterality: N/A;   HEMORRHOID SURGERY N/A 05/25/2018   Procedure: HEMORRHOIDECTOMY;  Surgeon: Mercy Stall, MD;  Location: [facility];  Service: General;  Laterality: N/A;   HERNIA REPAIR Bilateral    20'S   INSERTION OF MESH  04/17/2022   Procedure: INSERTION OF MESH;  Surgeon: Alben Alma, MD;  Location: [facility] OR[facility];  Service: General;;   KNEE ARTHROSCOPY W/ MENISCAL REPAIR Right    POLYPECTOMY N/A 02/03/2022   Procedure: POLYPECTOMY;  Surgeon: Marnee Sink, MD;  Location: [facility];  Service: Endoscopy;  Laterality: N/A;   TONSILLECTOMY AND ADENOIDECTOMY     TRIGGER FINGER RELEASE     x3   XI ROBOTIC ASSISTED  PARAESOPHAGEAL HERNIA REPAIR N/A 04/17/2022   Procedure: XI ROBOTIC ASSISTED PARAESOPHAGEAL HERNIA REPAIR, RNFA to assist;  Surgeon: Alben Alma, MD;  Location: [facility];  Service: General;  Laterality: N/A;    Medications:  Current Outpatient Medications on File Prior to Visit  Medication Sig   Multiple Vitamin (MULTIVITAMIN) tablet Take 1 tablet by mouth daily.   No current facility-administered medications on file prior to visit.    Allergies:  Allergies  Allergen Reactions   Aspirin Itching and Other (See Comments)    Eyes swell   Ibuprofen Itching and Other (See Comments)    Eyes swell   Dexilant  [Dexlansoprazole ] Rash   Protonix  [Pantoprazole  Sodium] Other (See Comments)    Increased acid reflux   Terbinafine  And Related Hives    Happened with  sun exposure, resolved with Benadryl     Social History:  Social History   Socioeconomic History   Marital status: Married    Spouse name: Not on file   Number of children: Not on file   Years of education: Not on file   Highest education level: Not on file  Occupational History   Not on file  Tobacco Use   Smoking status: Never   Smokeless tobacco: Never  Vaping Use   Vaping status: Never Used  Substance and Sexual Activity   Alcohol use: No   Drug use: No   Sexual activity: Yes  Other Topics Concern   Not on file  Social History Narrative   Not on file   Social Drivers of Health   Financial Resource Strain: Low Risk  (09/03/2023)   Overall Financial Resource Strain (CARDIA)    Difficulty of Paying Living Expenses: Not hard at all  Food Insecurity: No Food Insecurity (09/03/2023)   Hunger Vital Sign    Worried About Running Out of Food in the Last Year: Never true    Ran Out of Food in the Last Year: Never true  Transportation Needs: No Transportation Needs (09/03/2023)   PRAPARE - Administrator, Civil Service (Medical): No    Lack of Transportation (Non-Medical): No  Physical Activity: Sufficiently Active (09/03/2023)   Exercise Vital Sign    Days of Exercise per Week: 5 days    Minutes of Exercise per Session: 60 min  Stress: No Stress Concern Present (09/03/2023)   [employer] of Occupational Health - Occupational Stress Questionnaire    Feeling of Stress: Not at all  Social Connections: Socially Integrated (09/03/2023)   Social Connection and Isolation Panel    Frequency of Communication with Friends and Family: More than three times a week    Frequency of Social Gatherings with Friends and Family: More than three times a week    Attends Religious Services: More than 4 times per year    Active Member of [employer] or Organizations: Yes    Attends Engineer, structural: More than 4 times per year    Marital Status: Married  Catering manager  Violence: Not At Risk (09/03/2023)   Humiliation, Afraid, Rape, and Kick questionnaire    Fear of Current or Ex-Partner: No    Emotionally Abused: No    Physically Abused: No    Sexually Abused: No   Social History   Tobacco Use  Smoking Status Never  Smokeless Tobacco Never   Social History   Substance and Sexual Activity  Alcohol Use No    Family History:  Family History  Problem Relation Age of Onset   Osteoporosis Mother  GER disease Father    Cancer Paternal Uncle    Hypertension Maternal Grandfather     Past medical history, surgical history, medications, allergies, family history and social history reviewed with patient today and changes made to appropriate areas of the chart.   Review of Systems  Constitutional: Negative.   HENT: Negative.    Eyes: Negative.   Respiratory: Negative.    Cardiovascular: Negative.   Gastrointestinal: Negative.   Genitourinary: Negative.        Some hesitancy  Musculoskeletal: Negative.   Skin: Negative.   Neurological: Negative.   Endo/Heme/Allergies: Negative.   Psychiatric/Behavioral: Negative.     All other ROS negative except what is listed above and in the HPI.      Objective:    BP 122/72   Pulse 66   Temp 97.9 F (36.6 C) (Oral)   Wt 191 lb 12.8 oz (87 kg)   SpO2 96%   BMI 30.04 kg/m   Wt Readings from Last 3 Encounters:  09/03/23 191 lb 12.8 oz (87 kg)  03/04/23 187 lb (84.8 kg)  02/17/23 187 lb 12.8 oz (85.2 kg)    Physical Exam Vitals and nursing note reviewed.  Constitutional:      General: He is not in acute distress.    Appearance: Normal appearance. He is not ill-appearing, toxic-appearing or diaphoretic.  HENT:     Head: Normocephalic and atraumatic.     Right Ear: Tympanic membrane, ear canal and external ear normal. There is no impacted cerumen.     Left Ear: Tympanic membrane, ear canal and external ear normal. There is no impacted cerumen.     Nose: Nose normal. No congestion or  rhinorrhea.     Mouth/Throat:     Mouth: Mucous membranes are moist.     Pharynx: Oropharynx is clear. No oropharyngeal exudate or posterior oropharyngeal erythema.   Eyes:     General: No scleral icterus.       Right eye: No discharge.        Left eye: No discharge.     Extraocular Movements: Extraocular movements intact.     Conjunctiva/sclera: Conjunctivae normal.     Pupils: Pupils are equal, round, and reactive to light.   Neck:     Vascular: No carotid bruit.   Cardiovascular:     Rate and Rhythm: Normal rate and regular rhythm.     Pulses: Normal pulses.     Heart sounds: No murmur heard.    No friction rub. No gallop.  Pulmonary:     Effort: Pulmonary effort is normal. No respiratory distress.     Breath sounds: Normal breath sounds. No stridor. No wheezing, rhonchi or rales.  Chest:     Chest wall: No tenderness.  Abdominal:     General: Abdomen is flat. Bowel sounds are normal. There is no distension.     Palpations: Abdomen is soft. There is no mass.     Tenderness: There is no abdominal tenderness. There is no right CVA tenderness, left CVA tenderness, guarding or rebound.     Hernia: No hernia is present.  Genitourinary:    Comments: Genital exam deferred with shared decision making  Musculoskeletal:        General: No swelling, tenderness, deformity or signs of injury.     Cervical back: Normal range of motion and neck supple. No rigidity. No muscular tenderness.     Right lower leg: No edema.     Left lower leg: No edema.  Lymphadenopathy:     Cervical: No cervical adenopathy.   Skin:    General: Skin is warm and dry.     Capillary Refill: Capillary refill takes less than 2 seconds.     Coloration: Skin is not jaundiced or pale.     Findings: No bruising, erythema, lesion or rash.   Neurological:     General: No focal deficit present.     Mental Status: He is alert and oriented to person, place, and time.     Cranial Nerves: No cranial nerve deficit.      Sensory: No sensory deficit.     Motor: No weakness.     Coordination: Coordination normal.     Gait: Gait normal.     Deep Tendon Reflexes: Reflexes normal.   Psychiatric:        Mood and Affect: Mood normal.        Behavior: Behavior normal.        Thought Content: Thought content normal.        Judgment: Judgment normal.     Results for orders placed or performed during the hospital encounter of 02/17/23  Surgical pathology   Collection Time: 02/17/23 12:00 AM  Result Value Ref Range   SURGICAL PATHOLOGY      SURGICAL PATHOLOGY [facility] [address], [address] [place], [place] [address] Telephone [phone] or [phone] Fax [phone]  REPORT OF SURGICAL PATHOLOGY   Accession #: [id number] Patient Name: MARSHUN, DUVA Visit # : [id number]  MRN: [id number] Physician: Marnee Sink DOB/Age [birth year] (Age: 8) Gender: M Collected Date: 02/17/2023 Received Date: 02/17/2023  FINAL DIAGNOSIS       1. Transverse Colon Polyp, cold snare :       - HYPERPLASTIC POLYP (MULTIPLE FRAGMENTS)      - NEGATIVE FOR MALIGNANCY       DATE SIGNED OUT: 02/20/2023 ELECTRONIC SIGNATURE : Rodell Citrin M.D., Link Rice., Pathologist, Electronic Signature  MICROSCOPIC DESCRIPTION 1. Multiple deeper levels were examined.  CASE COMMENTS STAINS USED IN DIAGNOSIS: H&E *RECUT DEEPER X 9 LEVELS *RECUT DEEPER X 9 LEVELS *RECUT DEEPER X 9 LEVELS    CLINICAL HISTORY  SPECIMEN(S) OBTAINED 1. Transverse Colon Polyp, Cold Snare  SPECIMEN COMMENTS: SPECI MEN CLINICAL INFORMATION: 1. History of colon polyps, gastroesophageal reflux disease. Normal EGD, colon polyp    Gross Description 1. Received in formalin, labeled transverse colon polyp cold snare, is a 0.4 x 0.4 x 0.1 cm aggregate of multiple tan-gray polypoid tissue fragments submitted entirely in block 1A.  Please note that the specimen is delicate and may not survive processing.   (SB:kh 02/17/23)        Report signed out from the following location(s) [place] [address] [place], [place] [address] CLIA #: [id number]  [facility] [address] [place], [place] [address] CLIA #: [id number]       Assessment & Plan:   Problem List Items Addressed This Visit   None Visit Diagnoses       Routine general medical examination at a health care facility    -  Primary   Vaccines up to date/declined. Screening labs checked today. Colonoscopy up to date. Continue diet and exercise. Call with any concerns.   Relevant Orders   Comprehensive metabolic panel with GFR   CBC with Differential/Platelet   Lipid Panel w/o Chol/HDL Ratio   PSA   TSH        LABORATORY TESTING:  Health maintenance labs ordered today as discussed above.   The natural history of prostate cancer and ongoing controversy regarding screening and potential treatment outcomes of prostate cancer has been discussed with the patient. The meaning of a false positive PSA and a false negative PSA has been discussed. He indicates understanding of the limitations of this screening test and wishes to proceed with screening PSA testing.   IMMUNIZATIONS:   - Tdap: Tetanus vaccination status reviewed: last tetanus booster within 10 years. - Influenza: Postponed to flu season - Pneumovax: Not applicable - Prevnar: Not applicable - COVID: Refused - HPV: Not applicable - Shingrix vaccine: Refused  SCREENING: - Colonoscopy: Up to date  Discussed with patient purpose of the colonoscopy is to detect colon cancer at curable precancerous or early stages   PATIENT COUNSELING:    Sexuality: Discussed sexually transmitted diseases, partner selection, use of condoms, avoidance of unintended pregnancy  and contraceptive alternatives.   Advised to avoid cigarette smoking.  I discussed with the patient that most people either abstain from alcohol or drink within safe  limits (<=14/week and <=4 drinks/occasion for males, <=7/weeks and <= 3 drinks/occasion for females) and that the risk for alcohol disorders and other health effects rises proportionally with the number of drinks per week and how often a drinker exceeds daily limits.  Discussed cessation/primary prevention of drug use and availability of treatment for abuse.   Diet: Encouraged to adjust caloric intake to maintain  or achieve ideal body weight, to reduce intake of dietary saturated fat and total fat, to limit sodium intake by avoiding high sodium foods and not adding table salt, and to maintain adequate dietary potassium and calcium preferably from fresh fruits, vegetables, and low-fat dairy products.    stressed the importance of regular exercise  Injury prevention: Discussed safety belts, safety helmets, smoke detector, smoking near bedding or upholstery.   Dental health: Discussed importance of regular tooth brushing, flossing, and dental visits.   Follow up plan: NEXT PREVENTATIVE PHYSICAL DUE IN 1 YEAR. Return in about 1 year (around 09/02/2024) for physical.

## 2023-09-04 LAB — CBC WITH DIFFERENTIAL/PLATELET
Basophils Absolute: 0 10*3/uL (ref 0.0–0.2)
Basos: 1 %
EOS (ABSOLUTE): 0.1 10*3/uL (ref 0.0–0.4)
Eos: 1 %
Hematocrit: 50.9 % (ref 37.5–51.0)
Hemoglobin: 16.4 g/dL (ref 13.0–17.7)
Immature Grans (Abs): 0 10*3/uL (ref 0.0–0.1)
Immature Granulocytes: 0 %
Lymphocytes Absolute: 1.1 10*3/uL (ref 0.7–3.1)
Lymphs: 20 %
MCH: 29 pg (ref 26.6–33.0)
MCHC: 32.2 g/dL (ref 31.5–35.7)
MCV: 90 fL (ref 79–97)
Monocytes Absolute: 0.4 10*3/uL (ref 0.1–0.9)
Monocytes: 7 %
Neutrophils Absolute: 3.7 10*3/uL (ref 1.4–7.0)
Neutrophils: 71 %
Platelets: 232 10*3/uL (ref 150–450)
RBC: 5.65 x10E6/uL (ref 4.14–5.80)
RDW: 13.4 % (ref 11.6–15.4)
WBC: 5.2 10*3/uL (ref 3.4–10.8)

## 2023-09-04 LAB — COMPREHENSIVE METABOLIC PANEL WITH GFR
ALT: 30 IU/L (ref 0–44)
AST: 30 IU/L (ref 0–40)
Albumin: 4.3 g/dL (ref 3.9–4.9)
Alkaline Phosphatase: 155 IU/L — ABNORMAL HIGH (ref 44–121)
BUN/Creatinine Ratio: 19 (ref 10–24)
BUN: 18 mg/dL (ref 8–27)
Bilirubin Total: 0.5 mg/dL (ref 0.0–1.2)
CO2: 19 mmol/L — ABNORMAL LOW (ref 20–29)
Calcium: 9.4 mg/dL (ref 8.6–10.2)
Chloride: 104 mmol/L (ref 96–106)
Creatinine, Ser: 0.97 mg/dL (ref 0.76–1.27)
Globulin, Total: 2.5 g/dL (ref 1.5–4.5)
Glucose: 93 mg/dL (ref 70–99)
Potassium: 4.9 mmol/L (ref 3.5–5.2)
Sodium: 138 mmol/L (ref 134–144)
Total Protein: 6.8 g/dL (ref 6.0–8.5)
eGFR: 88 mL/min/{1.73_m2} (ref >59)

## 2023-09-04 LAB — LIPID PANEL W/O CHOL/HDL RATIO
Cholesterol, Total: 144 mg/dL (ref 100–199)
HDL: 42 mg/dL (ref >39)
LDL Chol Calc (NIH): 86 mg/dL (ref 0–99)
Triglycerides: 81 mg/dL (ref 0–149)
VLDL Cholesterol Cal: 16 mg/dL (ref 5–40)

## 2023-09-04 LAB — PSA: Prostate Specific Ag, Serum: 1.7 ng/mL (ref 0.0–4.0)

## 2023-09-04 LAB — TSH: TSH: 1.74 u[IU]/mL (ref 0.450–4.500)

## 2023-09-06 ENCOUNTER — Ambulatory Visit: Payer: Self-pay | Admitting: Family Medicine

## 2023-09-07 NOTE — Progress Notes (Signed)
 Letter printed and mailed.

## 2024-09-05 ENCOUNTER — Encounter: Admitting: Family Medicine
